# Patient Record
Sex: Female | Born: 1970
Health system: Southern US, Community
[De-identification: ages and names within clinical notes are randomized; demographics above are authoritative.]

## PROBLEM LIST (undated history)

## (undated) DIAGNOSIS — B029 Zoster without complications: Secondary | ICD-10-CM

## (undated) DIAGNOSIS — Z8742 Personal history of other diseases of the female genital tract: Secondary | ICD-10-CM

## (undated) HISTORY — DX: Zoster without complications: B02.9

## (undated) HISTORY — DX: Personal history of other diseases of the female genital tract: Z87.42

## (undated) HISTORY — PX: LASIK: SHX215

---

## 2003-07-16 ENCOUNTER — Encounter: Admission: RE | Admit: 2003-07-16 | Discharge: 2003-07-16 | Payer: Self-pay | Admitting: Family Medicine

## 2003-11-15 ENCOUNTER — Encounter: Admission: RE | Admit: 2003-11-15 | Discharge: 2003-11-15 | Payer: Self-pay | Admitting: Family Medicine

## 2003-11-24 ENCOUNTER — Encounter: Admission: RE | Admit: 2003-11-24 | Discharge: 2003-11-24 | Payer: Self-pay | Admitting: Family Medicine

## 2003-12-01 ENCOUNTER — Encounter: Admission: RE | Admit: 2003-12-01 | Discharge: 2003-12-01 | Payer: Self-pay | Admitting: Family Medicine

## 2004-03-06 ENCOUNTER — Other Ambulatory Visit: Admission: RE | Admit: 2004-03-06 | Discharge: 2004-03-06 | Payer: Self-pay | Admitting: Obstetrics and Gynecology

## 2004-11-19 IMAGING — CR DG LUMBAR SPINE COMPLETE 4+V
5 series · 5 of 5 positions shown · non-contrast
Comparison: none

CLINICAL DATA: Lower back pain extending into both legs for the last two weeks.  
 LUMBOSACRAL SPINE COMPLETE: 
 AP, lateral, and both oblique views of the lumbosacral spine without previous films for comparison show mild thoracolumbar scoliosis.  Intervertebral disc spaces, vertebral body alignment, and posterior elements are well maintained.  There are five typical lumbar segments.

[view not recorded (1 of 5)]
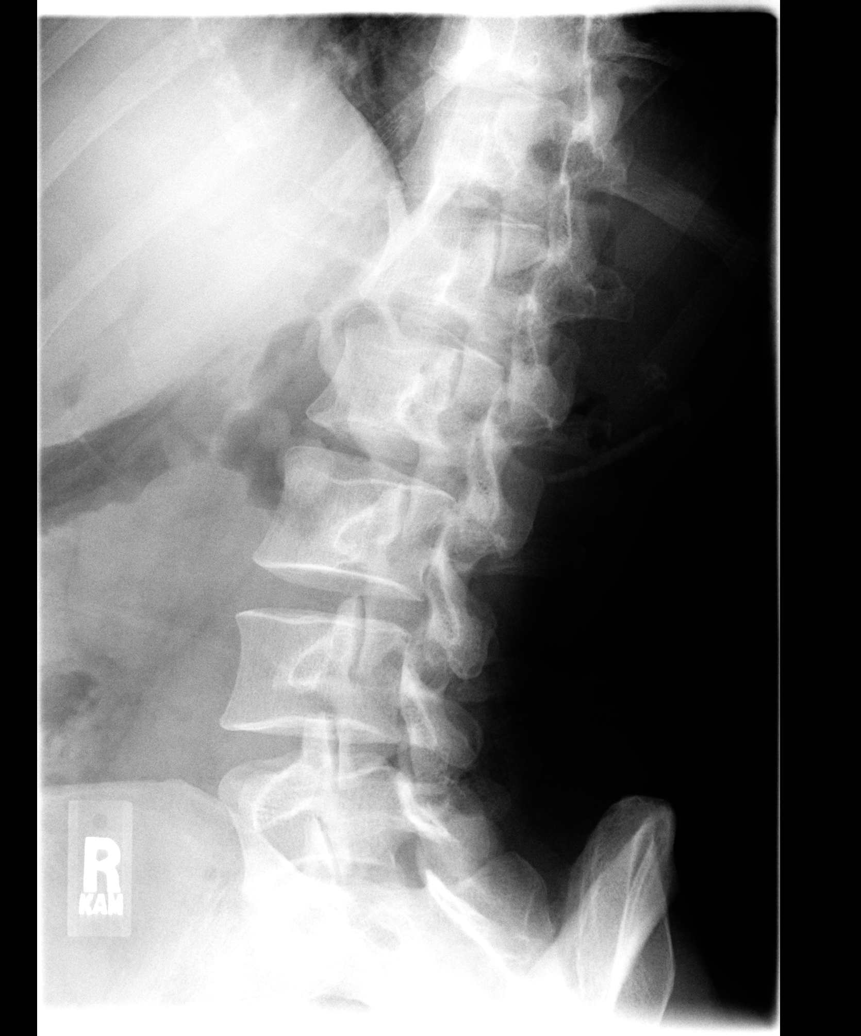

[view not recorded (2 of 5)]
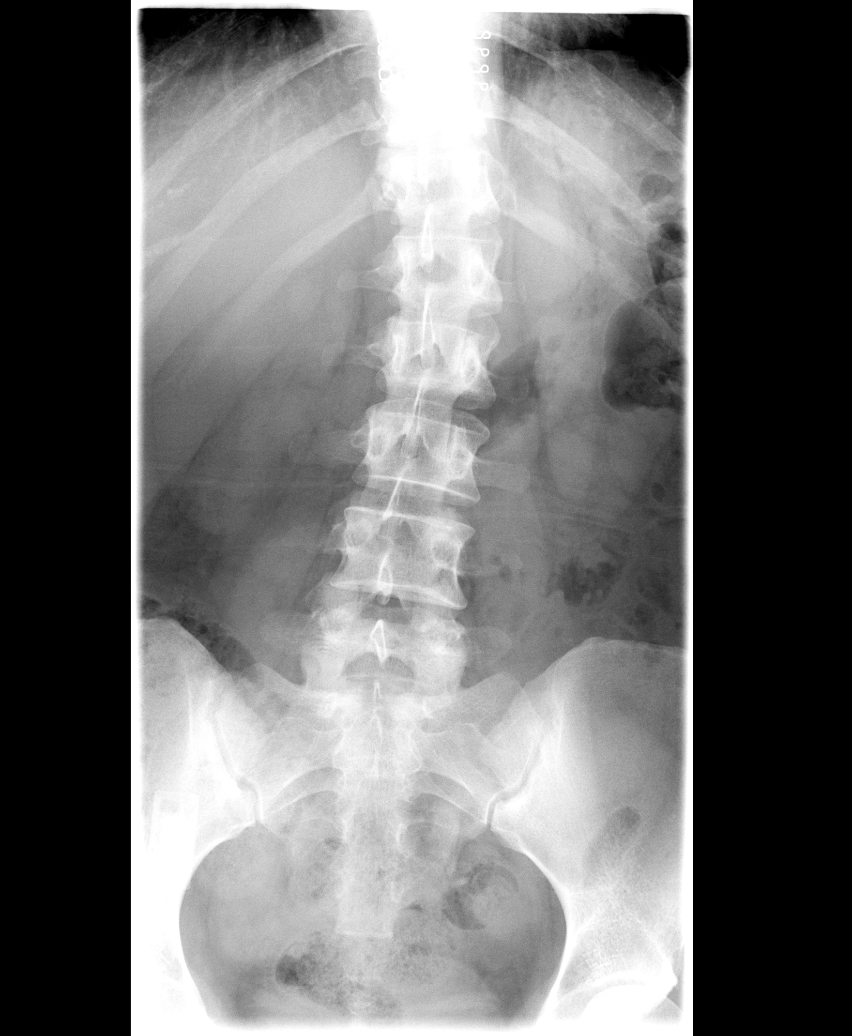

[view not recorded (3 of 5)]
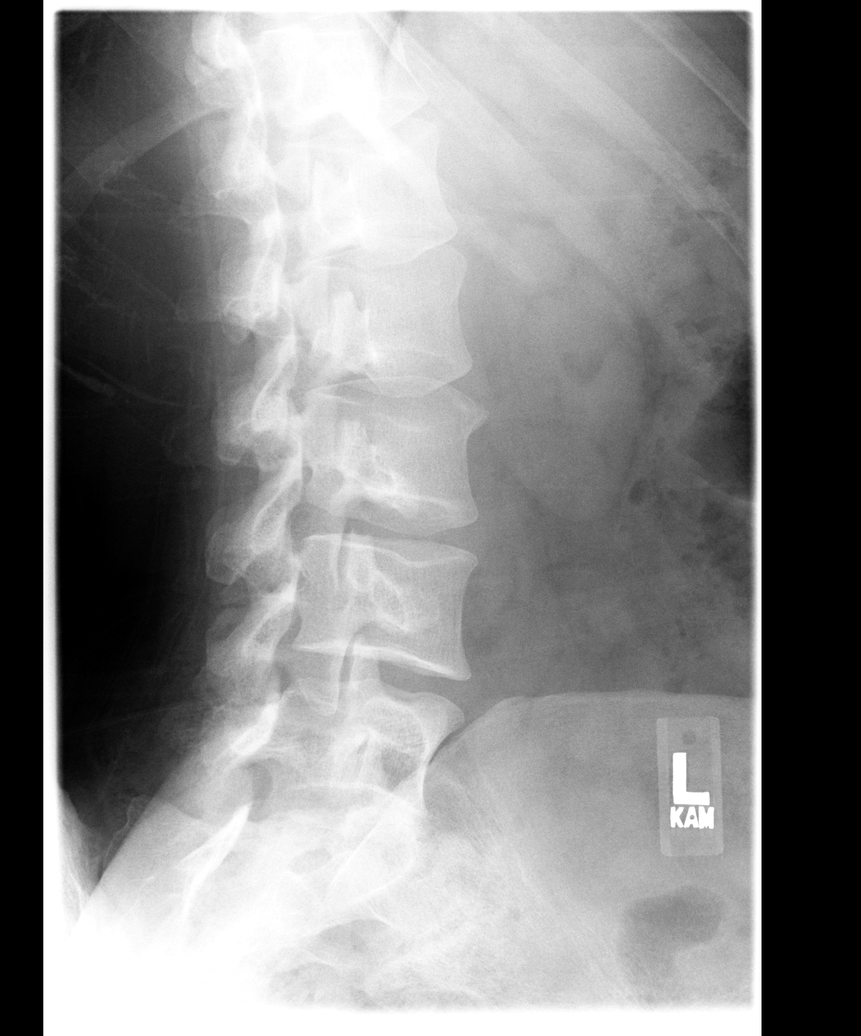

[view not recorded (4 of 5)]
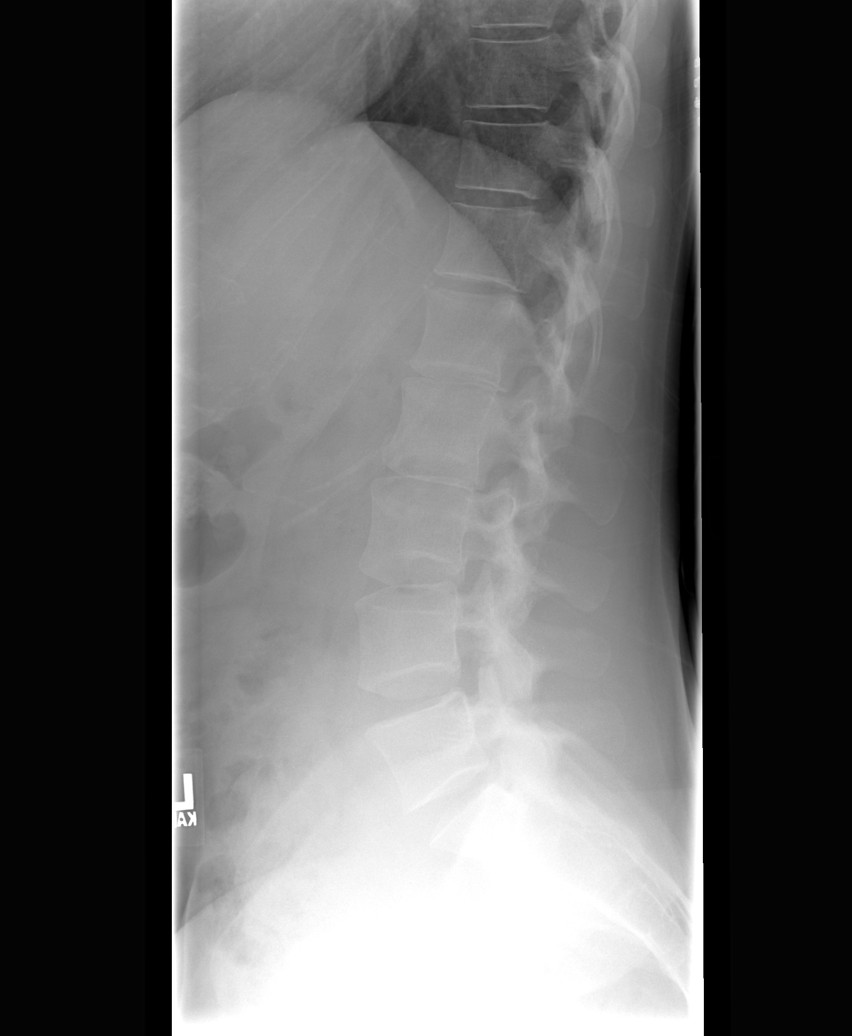

[view not recorded (5 of 5)]
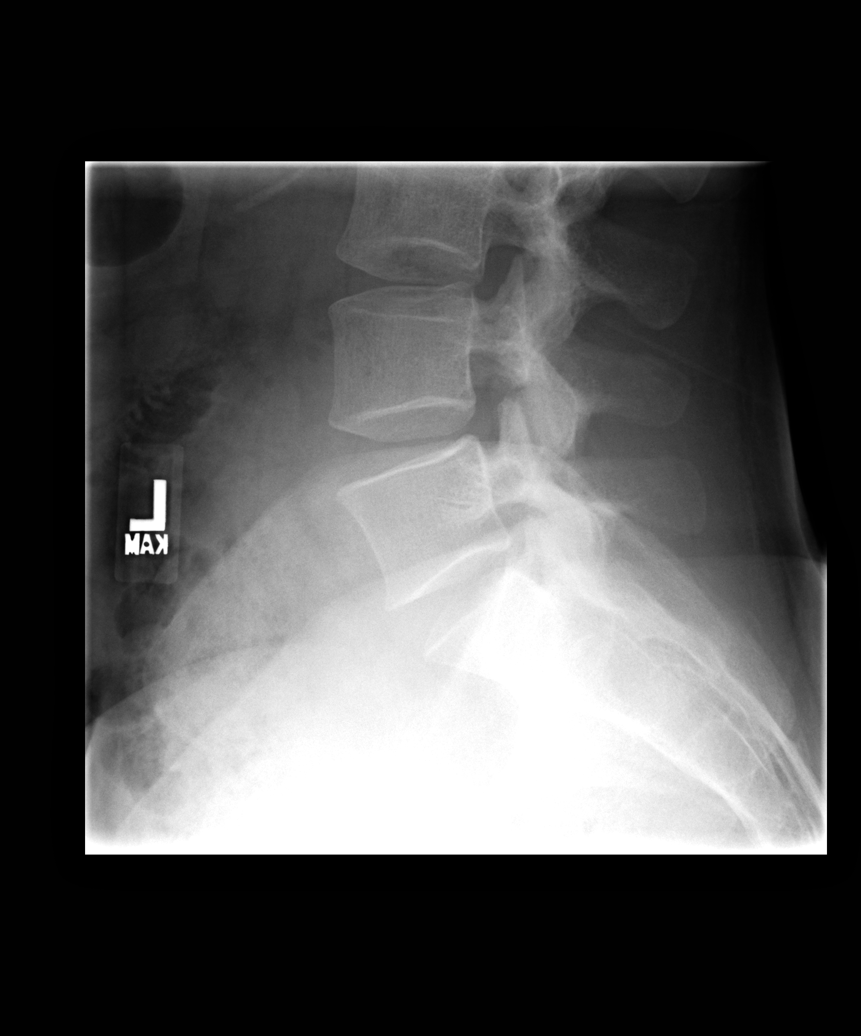

[5 of 5 positions shown; findings below may reference images not displayed]

IMPRESSION: Mild scoliosis.  Otherwise normal lumbosacral spine.

## 2005-04-27 ENCOUNTER — Other Ambulatory Visit: Admission: RE | Admit: 2005-04-27 | Discharge: 2005-04-27 | Payer: Self-pay | Admitting: Obstetrics and Gynecology

## 2006-05-23 ENCOUNTER — Ambulatory Visit: Payer: Self-pay | Admitting: Internal Medicine

## 2006-05-31 ENCOUNTER — Ambulatory Visit: Payer: Self-pay | Admitting: Cardiology

## 2006-06-10 ENCOUNTER — Encounter: Admission: RE | Admit: 2006-06-10 | Discharge: 2006-06-10 | Payer: Self-pay | Admitting: Obstetrics and Gynecology

## 2006-09-19 DIAGNOSIS — Z9889 Other specified postprocedural states: Secondary | ICD-10-CM

## 2007-03-13 ENCOUNTER — Telehealth: Payer: Self-pay | Admitting: Internal Medicine

## 2007-03-17 ENCOUNTER — Ambulatory Visit: Payer: Self-pay | Admitting: Internal Medicine

## 2007-03-17 DIAGNOSIS — R079 Chest pain, unspecified: Secondary | ICD-10-CM

## 2007-03-17 LAB — CONVERTED CEMR LAB
Basophils Absolute: 0 10*3/uL (ref 0.0–0.1)
Creatinine, Ser: 0.8 mg/dL (ref 0.4–1.2)
HCT: 39.1 % (ref 36.0–46.0)
Hemoglobin: 13.5 g/dL (ref 12.0–15.0)
MCHC: 34.4 g/dL (ref 30.0–36.0)
MCV: 90.4 fL (ref 78.0–100.0)
Monocytes Absolute: 0.6 10*3/uL (ref 0.2–0.7)
Neutrophils Relative %: 59.2 % (ref 43.0–77.0)
Potassium: 4.3 meq/L (ref 3.5–5.1)
RDW: 11.5 % (ref 11.5–14.6)
Sodium: 139 meq/L (ref 135–145)
TSH: 1.28 microintl units/mL (ref 0.35–5.50)

## 2007-04-20 ENCOUNTER — Encounter: Payer: Self-pay | Admitting: Internal Medicine

## 2007-09-19 ENCOUNTER — Encounter: Payer: Self-pay | Admitting: Internal Medicine

## 2008-08-08 ENCOUNTER — Encounter: Payer: Self-pay | Admitting: Internal Medicine

## 2009-06-27 ENCOUNTER — Ambulatory Visit: Payer: Self-pay | Admitting: Internal Medicine

## 2009-07-11 ENCOUNTER — Ambulatory Visit: Payer: Self-pay | Admitting: Internal Medicine

## 2009-07-14 LAB — CONVERTED CEMR LAB
Basophils Relative: 0.7 % (ref 0.0–3.0)
CO2: 29 meq/L (ref 19–32)
Calcium: 9.3 mg/dL (ref 8.4–10.5)
Eosinophils Relative: 2.6 % (ref 0.0–5.0)
GFR calc non Af Amer: 84.48 mL/min (ref >60)
HDL: 74.4 mg/dL (ref >39.00)
Hemoglobin: 13 g/dL (ref 12.0–15.0)
Lymphocytes Relative: 28.8 % (ref 12.0–46.0)
Monocytes Relative: 11.7 % (ref 3.0–12.0)
Neutro Abs: 2.9 10*3/uL (ref 1.4–7.7)
RBC: 4.05 M/uL (ref 3.87–5.11)
Sodium: 142 meq/L (ref 135–145)
Total CHOL/HDL Ratio: 2
VLDL: 11.8 mg/dL (ref 0.0–40.0)
WBC: 5.1 10*3/uL (ref 4.5–10.5)

## 2009-12-17 ENCOUNTER — Encounter: Admission: RE | Admit: 2009-12-17 | Discharge: 2009-12-17 | Payer: Self-pay | Admitting: Obstetrics and Gynecology

## 2010-03-30 ENCOUNTER — Encounter: Payer: Self-pay | Admitting: Internal Medicine

## 2010-05-24 ENCOUNTER — Encounter: Payer: Self-pay | Admitting: Family Medicine

## 2010-05-24 ENCOUNTER — Encounter: Payer: Self-pay | Admitting: Obstetrics and Gynecology

## 2010-06-02 NOTE — Letter (Signed)
Summary: Health Exam Form/[facility] [employer]  Health Exam Form/[facility] [employer]   Imported By: Lanelle Bal 07/07/2009 09:03:16  _____________________________________________________________________  External Attachment:    Type:   Image     Comment:   External Document

## 2010-06-02 NOTE — Letter (Signed)
Summary: Cancer Screening/[facility]  Cancer Screening/[facility]   Imported By: Lanelle Bal 07/09/2009 11:00:48  _____________________________________________________________________  External Attachment:    Type:   Image     Comment:   External Document

## 2010-06-02 NOTE — Assessment & Plan Note (Signed)
Summary: CPX/NS/[facility] PAPERWORK   Vital Signs:  Patient profile:   40 year old female Height:      63.50 inches Weight:      132 pounds BMI:     23.10 Pulse rate:   70 / minute BP sitting:   110 / 70  (left arm)  Vitals Entered By: Doristine Devoid (June 27, 2009 2:51 PM) CC: CPX    History of Present Illness: CPX  Allergies: 1)  ! * Ivp Dye  Past History:  Past Medical History: G3 P3 not doing family planning  Past Surgical History: Reviewed history from 03/17/2007 and no changes required. 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 CV:  Denies chest pain or discomfort and swelling of feet. Resp:  Denies cough and shortness of breath. GI:  Denies bloody stools, nausea, and vomiting. Psych:  (+) stress , occasionally depression, not enough to do something.  Physical Exam  General:  alert, well-developed, and well-nourished.   Lungs:  normal respiratory effort, no intercostal retractions, and no accessory muscle use.   Heart:  normal rate, regular rhythm, and no murmur.   Abdomen:  soft, non-tender, no distention, no masses, no guarding, and no rigidity.   Extremities:  no pretibial edema bilaterally  Neurologic:  alert & oriented X3, strength normal in all extremities, and gait normal.   Psych:  Cognition and judgment appear intact. Alert and cooperative with normal attention span and concentration.     Impression & Recommendations:  Problem # 1:  ROUTINE GENERAL MEDICAL EXAM@HEALTH  CARE FACL (ICD-V70.0) Td  2-11 never had a Cscope  sees gyn routinely  continue healthy lifestyle  paper work completed   Complete  Medication List: 1)  Allergy Injections   Other Orders: Tdap => 3yrs IM (11914) Admin 1st Vaccine (78295) TB Skin Test [phone]) Admin of Any Addtl Vaccine (86578)  Patient Instructions: 1)  came back Monday for  a PPD reading  2)  came back fasting for FLP BMP CBC TSH------ DX V70   Preventive Care Screening  Pap Smear:    Date:  10/31/2008    Results:  normal     Immunizations Administered:  Tetanus Vaccine:    Vaccine Type: Tdap    Site: right deltoid    Mfr: GlaxoSmithKline    Dose: 0.5 ml    Route: IM    Given by: Doristine Devoid    Exp. Date: 06/28/2011    Lot #: [id number]  PPD Skin Test:    Vaccine Type: PPD    Site: left forearm    Mfr: Sanofi Pasteur    Dose: 0.1 ml    Route: ID    Given by: Doristine Devoid    Exp. Date: 09/28/2011    Lot #: [id number]   Appended Document: CPX/NS/[facility] PAPERWORK     Clinical Lists Changes  Observations: Added new observation of TB PPDRESULT: negative (06/30/2009 11:45) Added new observation of PPD RESULT: < 5mm (06/30/2009 11:45) Added new observation of TB-PPD RDDTE: 06/30/2009 (06/30/2009 11:45)       PPD Results    Date of reading: 06/30/2009    Results: <  5mm    Interpretation: negative

## 2010-06-04 NOTE — Letter (Signed)
Summary: [facility]  [facility]   Imported By: Lanelle Bal 04/30/2010 11:11:23  _____________________________________________________________________  External Attachment:    Type:   Image     Comment:   External Document

## 2011-01-05 ENCOUNTER — Other Ambulatory Visit: Payer: Self-pay | Admitting: Obstetrics and Gynecology

## 2011-01-05 DIAGNOSIS — Z1231 Encounter for screening mammogram for malignant neoplasm of breast: Secondary | ICD-10-CM

## 2011-01-27 ENCOUNTER — Ambulatory Visit
Admission: RE | Admit: 2011-01-27 | Discharge: 2011-01-27 | Disposition: A | Discharge status: Home / Self Care | Payer: 59 | Source: Ambulatory Visit | Attending: Obstetrics and Gynecology | Admitting: Obstetrics and Gynecology

## 2011-01-27 DIAGNOSIS — Z1231 Encounter for screening mammogram for malignant neoplasm of breast: Secondary | ICD-10-CM

## 2011-12-28 ENCOUNTER — Other Ambulatory Visit: Payer: Self-pay | Admitting: Obstetrics and Gynecology

## 2011-12-28 DIAGNOSIS — Z1231 Encounter for screening mammogram for malignant neoplasm of breast: Secondary | ICD-10-CM

## 2012-02-01 ENCOUNTER — Ambulatory Visit
Admission: RE | Admit: 2012-02-01 | Discharge: 2012-02-01 | Disposition: A | Discharge status: Home / Self Care | Payer: 59 | Source: Ambulatory Visit | Attending: Obstetrics and Gynecology | Admitting: Obstetrics and Gynecology

## 2012-02-01 DIAGNOSIS — Z1231 Encounter for screening mammogram for malignant neoplasm of breast: Secondary | ICD-10-CM

## 2012-06-03 DIAGNOSIS — B029 Zoster without complications: Secondary | ICD-10-CM

## 2012-06-03 HISTORY — DX: Zoster without complications: B02.9

## 2012-08-28 ENCOUNTER — Ambulatory Visit (INDEPENDENT_AMBULATORY_CARE_PROVIDER_SITE_OTHER): Payer: 59 | Admitting: Internal Medicine

## 2012-08-28 ENCOUNTER — Encounter: Payer: Self-pay | Admitting: Internal Medicine

## 2012-08-28 VITALS — BP 108/74 | HR 67 | Wt 134.0 lb

## 2012-08-28 DIAGNOSIS — B070 Plantar wart: Secondary | ICD-10-CM

## 2012-08-28 NOTE — Progress Notes (Signed)
  Subjective:    Patient ID: Deborah Spencer, female    DOB: Mar 27, 1971, 42 y.o.   MRN: [id number]  HPI Acute visit, not seen in >3 years. 3 weeks ago noted hard, sore please at the right food, plantar area. Denies any injury that she can tell.pain worse w/ walking   Past Medical History: G3 P3 not doing family planning  Past Surgical History: lasik surgery Caesarean section x 3  Family History: MI--GM at age 54 breast ca--no colon ca-- GM late in life , GGM?   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    Objective:   Physical Exam BP 108/74  Pulse 67  Wt 134 lb (60.782 kg)  BMI 23.36 kg/m2  SpO2 97%   General -- alert, well-developed, No apparent distress .      Extremities-- no pretibial edema bilaterally. Left foot normal Right foot, plantar area, proximal from the 4 toe has a hard, skin colored structure c/w a wart Psych-- Cognition and judgment appear intact. Alert and cooperative with normal attention span and concentration.  not anxious appearing and not depressed appearing.          Assessment & Plan:

## 2012-08-28 NOTE — Patient Instructions (Addendum)
We are sending you to a podiatrist, please call in few days if you don't hear from [place]

## 2012-08-28 NOTE — Assessment & Plan Note (Signed)
Lesion consistent with a plantar wart. Plan: Refer to podiatry.

## 2013-01-08 ENCOUNTER — Other Ambulatory Visit: Payer: Self-pay

## 2013-01-08 DIAGNOSIS — Z1231 Encounter for screening mammogram for malignant neoplasm of breast: Secondary | ICD-10-CM

## 2013-02-06 ENCOUNTER — Ambulatory Visit
Admission: RE | Admit: 2013-02-06 | Discharge: 2013-02-06 | Disposition: A | Discharge status: Home / Self Care | Payer: 59 | Source: Ambulatory Visit

## 2013-02-06 DIAGNOSIS — Z1231 Encounter for screening mammogram for malignant neoplasm of breast: Secondary | ICD-10-CM

## 2013-06-06 ENCOUNTER — Encounter: Payer: Self-pay | Admitting: Nurse Practitioner

## 2013-06-06 ENCOUNTER — Ambulatory Visit (INDEPENDENT_AMBULATORY_CARE_PROVIDER_SITE_OTHER): Payer: BC Managed Care – PPO | Admitting: Nurse Practitioner

## 2013-06-06 VITALS — BP 110/70 | HR 66 | Temp 98.5°F | Ht 63.16 in | Wt 138.6 lb

## 2013-06-06 DIAGNOSIS — B029 Zoster without complications: Secondary | ICD-10-CM

## 2013-06-06 MED ORDER — LIDOCAINE 5 % EX PTCH
1.0000 | MEDICATED_PATCH | CUTANEOUS | Status: DC
Start: 2013-06-06 — End: 2013-07-17

## 2013-06-06 MED ORDER — VALACYCLOVIR HCL 1 G PO TABS: 1000.0000 mg | ORAL_TABLET | Freq: Three times a day (TID) | ORAL | Status: DC

## 2013-06-06 NOTE — Progress Notes (Signed)
   Subjective:    Patient ID: Deborah Spencer, female    DOB: 12-06-1970, 43 y.o.   MRN: [id number]  Rash This is a new problem. The current episode started in the past 7 days (3da). The problem has been gradually worsening since onset. The affected locations include the torso. The rash is characterized by blistering. She was exposed to nothing. Associated symptoms include fatigue. Pertinent negatives include no congestion, cough, diarrhea, fever, joint pain, shortness of breath or sore throat. Past treatments include nothing. Her past medical history is significant for varicella.      Review of Systems  Constitutional: Positive for fatigue. Negative for fever.  HENT: Negative for congestion and sore throat.   Respiratory: Negative for cough and shortness of breath.   Gastrointestinal: Negative for abdominal pain and diarrhea.  Musculoskeletal: Negative for arthralgias, joint pain and myalgias.  Skin: Positive for rash (burns, feels like sunburn).  Neurological: Negative for headaches.       Objective:   Physical Exam  Vitals reviewed. Constitutional: She is oriented to person, place, and time. She appears well-developed and well-nourished. No distress.  HENT:  Head: Normocephalic and atraumatic.  Eyes: Conjunctivae are normal. Right eye exhibits no discharge. Left eye exhibits no discharge.  Cardiovascular: Normal rate.   Pulmonary/Chest: Effort normal.  Neurological: She is alert and oriented to person, place, and time.  Skin: Skin is warm and dry. Rash noted. Rash is vesicular.     Psychiatric: She has a normal mood and affect. Her behavior is normal. Thought content normal.          Assessment & Plan:  1. Shingles Vesicular rash, dermatomal pattern L torso & upper thigh - valACYclovir (VALTREX) 1000 MG tablet; Take 1 tablet (1,000 mg total) by mouth 3 (three) times daily.  Dispense: 20 tablet; Refill: 0 - lidocaine (LIDODERM) 5 %; Place 1 patch onto the skin daily.  Remove & Discard patch within 12 hours or as directed by MD  Dispense: 30 patch; Refill: 0 F/u 6 wks.

## 2013-06-06 NOTE — Progress Notes (Signed)
Pre-visit discussion using our clinic review tool. No additional management support is needed unless otherwise documented below in the visit note.  

## 2013-06-06 NOTE — Patient Instructions (Signed)
Please follow up on 6 weeks or sooner if feeling worse. Mild soap daily. Feel better!  Shingles Shingles (herpes zoster) is an infection that is caused by the same virus that causes chickenpox (varicella). The infection causes a painful skin rash and fluid-filled blisters, which eventually break open, crust over, and heal. It may occur in any area of the body, but it usually affects only one side of the body or face. The pain of shingles usually lasts about 1 month. However, some people with shingles may develop long-term (chronic) pain in the affected area of the body. Shingles often occurs many years after the person had chickenpox. It is more common:  In people older than 50 years.  In people with weakened immune systems, such as those with HIV, AIDS, or cancer.  In people taking medicines that weaken the immune system, such as transplant medicines.  In people under great stress. CAUSES  Shingles is caused by the varicella zoster virus (VZV), which also causes chickenpox. After a person is infected with the virus, it can remain in the person's body for years in an inactive state (dormant). To cause shingles, the virus reactivates and breaks out as an infection in a nerve root. The virus can be spread from person to person (contagious) through contact with open blisters of the shingles rash. It will only spread to people who have not had chickenpox. When these people are exposed to the virus, they may develop chickenpox. They will not develop shingles. Once the blisters scab over, the person is no longer contagious and cannot spread the virus to others. SYMPTOMS  Shingles shows up in stages. The initial symptoms may be pain, itching, and tingling in an area of the skin. This pain is usually described as burning, stabbing, or throbbing.In a few days or weeks, a painful red rash will appear in the area where the pain, itching, and tingling were felt. The rash is usually on one side of the body in a  band or belt-like pattern. Then, the rash usually turns into fluid-filled blisters. They will scab over and dry up in approximately 2 3 weeks. Flu-like symptoms may also occur with the initial symptoms, the rash, or the blisters. These may include:  Fever.  Chills.  Headache.  Upset stomach. DIAGNOSIS  Your caregiver will perform a skin exam to diagnose shingles. Skin scrapings or fluid samples may also be taken from the blisters. This sample will be examined under a microscope or sent to a lab for further testing. TREATMENT  There is no specific cure for shingles. Your caregiver will likely prescribe medicines to help you manage the pain, recover faster, and avoid long-term problems. This may include antiviral drugs, anti-inflammatory drugs, and pain medicines. HOME CARE INSTRUCTIONS   Take a cool bath or apply cool compresses to the area of the rash or blisters as directed. This may help with the pain and itching.   Only take over-the-counter or prescription medicines as directed by your caregiver.   Rest as directed by your caregiver.  Keep your rash and blisters clean with mild soap and cool water or as directed by your caregiver.  Do not pick your blisters or scratch your rash. Apply an anti-itch cream or numbing creams to the affected area as directed by your caregiver.  Keep your shingles rash covered with a loose bandage (dressing).  Avoid skin contact with:  Babies.   Pregnant women.   Children with eczema.   Elderly people with transplants.  People with chronic illnesses, such as leukemia or AIDS.   Wear loose-fitting clothing to help ease the pain of material rubbing against the rash.  Keep all follow-up appointments with your caregiver.If the area involved is on your face, you may receive a referral for follow-up to a specialist, such as an eye doctor (ophthalmologist) or an ear, nose, and throat (ENT) doctor. Keeping all follow-up appointments will  help you avoid eye complications, chronic pain, or disability.  SEEK IMMEDIATE MEDICAL CARE IF:   You have facial pain, pain around the eye area, or loss of feeling on one side of your face.  You have ear pain or ringing in your ear.  You have loss of taste.  Your pain is not relieved with prescribed medicines.   Your redness or swelling spreads.   You have more pain and swelling.  Your condition is worsening or has changed.   You have a feveror persistent symptoms for more than 2 3 days.  You have a fever and your symptoms suddenly get worse. MAKE SURE YOU:  Understand these instructions.  Will watch your condition.  Will get help right away if you are not doing well or get worse. Document Released: 04/19/2005 Document Revised: 01/12/2012 Document Reviewed: 12/02/2011 [facility] Patient Information 2014 [place], [place].

## 2013-07-17 ENCOUNTER — Ambulatory Visit (INDEPENDENT_AMBULATORY_CARE_PROVIDER_SITE_OTHER): Payer: BC Managed Care – PPO | Admitting: Internal Medicine

## 2013-07-17 ENCOUNTER — Encounter: Payer: Self-pay | Admitting: Internal Medicine

## 2013-07-17 VITALS — BP 110/68 | HR 68 | Temp 97.7°F | Wt 137.0 lb

## 2013-07-17 DIAGNOSIS — B029 Zoster without complications: Secondary | ICD-10-CM

## 2013-07-17 NOTE — Progress Notes (Signed)
Pre visit review using our clinic review tool, if applicable. No additional management support is needed unless otherwise documented below in the visit note. 

## 2013-07-17 NOTE — Progress Notes (Signed)
   Subjective:    Patient ID: Deborah Spencer, female    DOB: [birth year], 43 y.o.   MRN: [id number]  DOS:  07/17/2013 Type of  visit:  Followup from previous visit    Was seen last month with shingles, status post Valtrex, used a  Lidoderm patch. Pain was intense for 2 weeks but now is essentially resolved.  ROS On looking back, the week before the shingles, she was under a lot of stress, didn't have  much asleep, had URI/viral syndrome as well.   Past Medical History  Diagnosis Date  . Shingles 06-2012    Past Surgical History  Procedure Laterality Date  . Lasik    . Cesarean section      x 3     History   Social History  . Marital Status: Married    Spouse Name: N/A    Number of Children: 3  . Years of Education: N/A   Occupational History  . subs- teacher     Social History Main Topics  . Smoking status: Never Smoker   . Smokeless tobacco: Never Used  . Alcohol Use: Yes     Comment: socially   . Drug Use: No  . Sexual Activity: Not on file   Other Topics Concern  . Not on file   Social History Narrative  . No narrative on file        Medication List       This list is accurate as of: 07/17/13 11:59 PM.  Always use your most recent med list.               CALCIUM PO  Take by mouth daily.     NON FORMULARY  every 14 (fourteen) days. Allergy Injections           Objective:   Physical Exam BP 110/68  Pulse 68  Temp(Src) 97.7 F (36.5 C)  Wt 137 lb (62.143 kg)  SpO2 98% General -- alert, well-developed, NAD.  Skin-- See previous note, she has a few hyperpigmented patches and areas of a rash  Neurologic--  alert & oriented X3. Speech normal, gait normal, strength normal in all extremities.  Psych-- Cognition and judgment appear intact. Cooperative with normal attention span and concentration. No anxious or depressed appearing.      Assessment & Plan:    Shingles-- Resolved. Patient has several questions about this , I answered  them to the best of my ability. For her general care she sees gynecology, followup here when necessary.

## 2013-07-17 NOTE — Patient Instructions (Signed)
Please call if the rash comes back

## 2014-01-01 ENCOUNTER — Other Ambulatory Visit: Payer: Self-pay

## 2014-01-01 DIAGNOSIS — Z1231 Encounter for screening mammogram for malignant neoplasm of breast: Secondary | ICD-10-CM

## 2014-02-07 ENCOUNTER — Ambulatory Visit: Payer: BC Managed Care – PPO

## 2014-05-06 LAB — HM MAMMOGRAPHY: HM MAMMO: NORMAL

## 2014-05-14 LAB — HM PAP SMEAR

## 2014-07-15 ENCOUNTER — Encounter: Payer: Self-pay | Admitting: Internal Medicine

## 2014-07-15 ENCOUNTER — Ambulatory Visit (INDEPENDENT_AMBULATORY_CARE_PROVIDER_SITE_OTHER): Payer: BLUE CROSS/BLUE SHIELD | Admitting: Internal Medicine

## 2014-07-15 VITALS — BP 118/64 | HR 68 | Temp 97.6°F | Wt 138.5 lb

## 2014-07-15 DIAGNOSIS — R319 Hematuria, unspecified: Secondary | ICD-10-CM

## 2014-07-15 DIAGNOSIS — R350 Frequency of micturition: Secondary | ICD-10-CM

## 2014-07-15 DIAGNOSIS — N390 Urinary tract infection, site not specified: Secondary | ICD-10-CM

## 2014-07-15 LAB — POCT URINALYSIS DIPSTICK
Bilirubin, UA: NEGATIVE
GLUCOSE UA: NEGATIVE
KETONES UA: NEGATIVE
Nitrite, UA: NEGATIVE
PROTEIN UA: NEGATIVE
Spec Grav, UA: 1.025
UROBILINOGEN UA: 0.2
pH, UA: 6

## 2014-07-15 MED ORDER — NITROFURANTOIN MONOHYD MACRO 100 MG PO CAPS: 100.0000 mg | ORAL_CAPSULE | Freq: Two times a day (BID) | ORAL | Status: DC

## 2014-07-15 NOTE — Progress Notes (Signed)
Pre visit review using our clinic review tool, if applicable. No additional management support is needed unless otherwise documented below in the visit note. 

## 2014-07-15 NOTE — Progress Notes (Signed)
   Subjective:    Patient ID: Deborah Spencer, female    DOB: May 13, 1970, 44 y.o.   MRN: [id number]  DOS:  07/15/2014 Type of visit - description : acute Interval history: Symptoms started 2 days ago with urinary pressure, urgency and a feeling that she didn't void completely. Suspects she  has a UTI, no history of frequent or recurrent UTIs   Review of Systems Denies fever chills No nausea, vomiting, diarrhea No vaginal bleeding or discharge  Past Medical History  Diagnosis Date  . Shingles 06-2012    Past Surgical History  Procedure Laterality Date  . Lasik    . Cesarean section      x 3     History   Social History  . Marital Status: Married    Spouse Name: N/A  . Number of Children: 3  . Years of Education: N/A   Occupational History  . subs- teacher     Social History Main Topics  . Smoking status: Never Smoker   . Smokeless tobacco: Never Used  . Alcohol Use: Yes     Comment: socially   . Drug Use: No  . Sexual Activity: Not on file   Other Topics Concern  . Not on file   Social History Narrative        Medication List       This list is accurate as of: 07/15/14  6:12 PM.  Always use your most recent med list.               CALCIUM PO  Take by mouth daily.     nitrofurantoin (macrocrystal-monohydrate) 100 MG capsule  Commonly known as:  MACROBID  Take 1 capsule (100 mg total) by mouth 2 (two) times daily.     NON FORMULARY  every 14 (fourteen) days. Allergy Injections           Objective:   Physical Exam BP 118/64 mmHg  Pulse 68  Temp(Src) 97.6 F (36.4 C) (Oral)  Wt 138 lb 8 oz (62.823 kg)  SpO2 97%  LMP 06/30/2014  General:   Well developed, well nourished . NAD.  HEENT:  Normocephalic . Face symmetric, atraumatic abd-- Not distended, nontender, no mass or rebound.   no CVA tenderness skin: Not pale. Not jaundice Neurologic:  alert & oriented X3.  Speech normal, gait appropriate for age and unassisted Psych--    Cognition and judgment appear intact.  Cooperative with normal attention span and concentration.  Behavior appropriate. No anxious or depressed appearing.        Assessment & Plan:      UTI udip  and symptoms consistent with a UTI. Will treat with macrobid, OTC Azo-Standard. (Birth control is by using condoms, last menstrual period 06/30/2014) Interestingly, the patient was sexually active few hours before the onset of symptoms, if this becomes a pattern she will let me know or talk with her gynecologist.

## 2014-07-15 NOTE — Patient Instructions (Signed)
Take antibiotics as prescribed Okay to use Azo-Standard OTC for pain Drink plenty of fluids Call if not improving in the next few days

## 2014-07-16 LAB — URINALYSIS, ROUTINE W REFLEX MICROSCOPIC
BILIRUBIN URINE: NEGATIVE
KETONES UR: NEGATIVE
Nitrite: NEGATIVE
Specific Gravity, Urine: 1.015 (ref 1.000–1.030)
Total Protein, Urine: NEGATIVE
URINE GLUCOSE: NEGATIVE
UROBILINOGEN UA: 0.2 (ref 0.0–1.0)
pH: 6 (ref 5.0–8.0)

## 2014-07-18 LAB — URINE CULTURE: Colony Count: >=100000

## 2014-09-25 LAB — HM PAP SMEAR: HM Pap smear: NEGATIVE

## 2015-10-20 LAB — HM MAMMOGRAPHY

## 2015-10-20 LAB — HM PAP SMEAR

## 2015-10-23 ENCOUNTER — Encounter: Payer: Self-pay | Admitting: Internal Medicine

## 2016-02-20 HISTORY — PX: ENDOMETRIAL BIOPSY: SHX622

## 2016-12-07 LAB — HM MAMMOGRAPHY

## 2016-12-15 ENCOUNTER — Encounter: Payer: Self-pay | Admitting: Internal Medicine

## 2017-12-13 LAB — HM PAP SMEAR

## 2017-12-13 LAB — HM MAMMOGRAPHY

## 2017-12-14 ENCOUNTER — Encounter: Payer: Self-pay | Admitting: Internal Medicine

## 2017-12-20 ENCOUNTER — Encounter: Payer: Self-pay | Admitting: Internal Medicine

## 2019-02-16 DIAGNOSIS — Z1231 Encounter for screening mammogram for malignant neoplasm of breast: Secondary | ICD-10-CM | POA: Diagnosis not present

## 2019-02-16 DIAGNOSIS — Z130 Encounter for screening for diseases of the blood and blood-forming organs and certain disorders involving the immune mechanism: Secondary | ICD-10-CM | POA: Diagnosis not present

## 2019-02-16 DIAGNOSIS — Z01419 Encounter for gynecological examination (general) (routine) without abnormal findings: Secondary | ICD-10-CM | POA: Diagnosis not present

## 2019-02-16 DIAGNOSIS — Z6825 Body mass index (BMI) 25.0-25.9, adult: Secondary | ICD-10-CM | POA: Diagnosis not present

## 2019-02-16 DIAGNOSIS — Z1389 Encounter for screening for other disorder: Secondary | ICD-10-CM | POA: Diagnosis not present

## 2019-12-24 DIAGNOSIS — D225 Melanocytic nevi of trunk: Secondary | ICD-10-CM | POA: Diagnosis not present

## 2019-12-24 DIAGNOSIS — Z1283 Encounter for screening for malignant neoplasm of skin: Secondary | ICD-10-CM | POA: Diagnosis not present

## 2020-03-04 DIAGNOSIS — R319 Hematuria, unspecified: Secondary | ICD-10-CM | POA: Diagnosis not present

## 2020-03-04 DIAGNOSIS — Z1231 Encounter for screening mammogram for malignant neoplasm of breast: Secondary | ICD-10-CM | POA: Diagnosis not present

## 2020-03-04 DIAGNOSIS — Z1389 Encounter for screening for other disorder: Secondary | ICD-10-CM | POA: Diagnosis not present

## 2020-03-04 DIAGNOSIS — Z6825 Body mass index (BMI) 25.0-25.9, adult: Secondary | ICD-10-CM | POA: Diagnosis not present

## 2020-03-04 DIAGNOSIS — Z01419 Encounter for gynecological examination (general) (routine) without abnormal findings: Secondary | ICD-10-CM | POA: Diagnosis not present

## 2020-03-04 DIAGNOSIS — Z130 Encounter for screening for diseases of the blood and blood-forming organs and certain disorders involving the immune mechanism: Secondary | ICD-10-CM | POA: Diagnosis not present

## 2020-07-29 DIAGNOSIS — Z1211 Encounter for screening for malignant neoplasm of colon: Secondary | ICD-10-CM | POA: Diagnosis not present

## 2020-07-29 DIAGNOSIS — K582 Mixed irritable bowel syndrome: Secondary | ICD-10-CM | POA: Diagnosis not present

## 2020-07-29 DIAGNOSIS — K219 Gastro-esophageal reflux disease without esophagitis: Secondary | ICD-10-CM | POA: Diagnosis not present

## 2020-08-27 DIAGNOSIS — Z800 Family history of malignant neoplasm of digestive organs: Secondary | ICD-10-CM | POA: Diagnosis not present

## 2020-08-27 DIAGNOSIS — D128 Benign neoplasm of rectum: Secondary | ICD-10-CM | POA: Diagnosis not present

## 2020-08-27 DIAGNOSIS — Z1211 Encounter for screening for malignant neoplasm of colon: Secondary | ICD-10-CM | POA: Diagnosis not present

## 2020-08-27 DIAGNOSIS — K621 Rectal polyp: Secondary | ICD-10-CM | POA: Diagnosis not present

## 2020-08-27 DIAGNOSIS — K635 Polyp of colon: Secondary | ICD-10-CM | POA: Diagnosis not present

## 2020-08-27 DIAGNOSIS — D123 Benign neoplasm of transverse colon: Secondary | ICD-10-CM | POA: Diagnosis not present

## 2020-08-27 LAB — HM COLONOSCOPY

## 2021-01-13 ENCOUNTER — Encounter: Payer: Self-pay | Admitting: Nurse Practitioner

## 2021-01-13 ENCOUNTER — Ambulatory Visit (INDEPENDENT_AMBULATORY_CARE_PROVIDER_SITE_OTHER): Payer: BC Managed Care – PPO | Admitting: Nurse Practitioner

## 2021-01-13 ENCOUNTER — Other Ambulatory Visit: Payer: Self-pay

## 2021-01-13 VITALS — BP 110/80 | HR 76 | Temp 97.5°F | Ht 63.5 in | Wt 148.6 lb

## 2021-01-13 DIAGNOSIS — M7061 Trochanteric bursitis, right hip: Secondary | ICD-10-CM

## 2021-01-13 DIAGNOSIS — N951 Menopausal and female climacteric states: Secondary | ICD-10-CM | POA: Insufficient documentation

## 2021-01-13 DIAGNOSIS — R7989 Other specified abnormal findings of blood chemistry: Secondary | ICD-10-CM

## 2021-01-13 DIAGNOSIS — R945 Abnormal results of liver function studies: Secondary | ICD-10-CM | POA: Insufficient documentation

## 2021-01-13 HISTORY — DX: Other specified abnormal findings of blood chemistry: R79.89

## 2021-01-13 MED ORDER — NAPROXEN SODIUM 550 MG PO TABS
550.0000 mg | ORAL_TABLET | Freq: Two times a day (BID) | ORAL | 0 refills | Status: DC
Start: 2021-01-13 — End: 2022-04-08

## 2021-01-13 MED ORDER — METHYLPREDNISOLONE ACETATE 80 MG/ML IJ SUSP
40.0000 mg | Freq: Once | INTRAMUSCULAR | Status: AC
  Administered 2021-01-13: 40 mg via INTRA_ARTICULAR

## 2021-01-13 NOTE — Patient Instructions (Addendum)
Start hip exercise on 01/15/2021 Apply cold compression after exercise. Start naproxen tabs on 01/15/21. You can use nonsteroidal anti-inflammatories like naproxen and cold compresses. Rest is recommended in the next 24 hours. You need to report immediately  if fever, chills or any signs of infection develop.   Hip Bursitis Hip bursitis is swelling of one or more fluid-filled sacs (bursae) in your hip joint. This condition can cause pain, and your symptoms may come and go over time. What are the causes? Repeated use of your hip muscles. Injury to the hip. Weak butt muscles. Bone spurs. Infection. In some cases, the cause may not be known. What increases the risk? You are more likely to develop this condition if: You had a past hip injury or hip surgery. You have a condition, such as arthritis, gout, diabetes, or thyroid disease. You have spine problems. You have one leg that is shorter than the other. You run a lot or do long-distance running. You play sports where there is a risk of injury or falling, such as football, martial arts, or skiing. What are the signs or symptoms? Symptoms may come and go, and they often include: Pain in the hip or groin area. Pain may get worse when you move your hip. Tenderness and swelling of the hip. In rare cases, the bursa may become infected. If this happens, you may get a fever, as well as have warmth and redness in the hip area. How is this treated? This condition is treated by: Resting your hip. Icing your hip. Wrapping the hip area with an elastic bandage (compression wrap). Keeping the hip raised. Other treatments may include medicine, draining fluid out of the bursa, or using crutches, a cane, or a walker. Surgery may be needed, but this is rare. Long-term treatment may include doing exercises to help your strength and flexibility. It may also include lifestyle changes like losing weight to lessen the strain on your hip. Follow these  instructions at home: Managing pain, stiffness, and swelling   If told, put ice on the painful area. Put ice in a plastic bag. Place a towel between your skin and the bag. Leave the ice on for 20 minutes, 2-3 times a day. Raise your hip by putting a pillow under your hips while you lie down. Stop if you feel pain. If told, put heat on the affected area. Do this as often as told by your doctor. Use a moist heat pack or a heating pad as told by your doctor. Place a towel between your skin and the heat source. Leave the heat on for 20-30 minutes. Take off the heat if your skin turns bright red. This is very important if you are unable to feel pain, heat, or cold. You may have a greater risk of getting burned. Activity Do not use your hip to support your body weight until your doctor says that you can. Use crutches, a cane, or a walker as told by your doctor. If the affected leg is one that you use to drive, ask your doctor if it is safe to drive. Rest and protect your hip as much as you can until you feel better. Return to your normal activities as told by your doctor. Ask your doctor what activities are safe for you. Do exercises as told by your doctor. General instructions Take over-the-counter and prescription medicines only as told by your doctor. Gently rub and stretch your injured area as often as is comfortable. Wear elastic bandages only as  told by your doctor. If one of your legs is shorter than the other, get fitted for a shoe insert or orthotic. Keep a healthy weight. Follow instructions from your doctor. Keep all follow-up visits as told by your doctor. This is important. How is this prevented? Exercise regularly, as told by your doctor. Wear the right shoes for the sport you play. Warm up and stretch before being active. Cool down and stretch after being active. Take breaks often from repeated activity. Avoid activities that bother your hip or cause pain. Avoid sitting down  for a long time. Where to find more information American [employer]: orthoinfo.aaos.org Contact a doctor if: You have a fever. You have new symptoms. You have trouble walking or doing everyday activities. You have pain that gets worse or does not get better with medicine. Your skin around your hip is red. You get a feeling of warmth in your hip area. Get help right away if: You cannot move your hip. You have very bad pain. You cannot control the muscles in your feet. Summary Hip bursitis is swelling of one or more fluid-filled sacs (bursae) in your hip joint. Symptoms often come and go over time. This condition is often treated by resting and icing the hip. It also may help to keep the area raised and wrapped in an elastic bandage. Other treatments may be needed. This information is not intended to replace advice given to you by your health care provider. Make sure you discuss any questions you have with your health care provider. Document Revised: 02/19/2019 Document Reviewed: 12/26/2017 Elsevier Patient Education  2022 [employer]  Hip Bursitis Rehab Ask your health care provider which exercises are safe for you. Do exercises exactly as told by your health care provider and adjust them as directed. It is normal to feel mild stretching, pulling, tightness, or discomfort as you do these exercises. Stop right away if you feel sudden pain or your pain gets worse. Do not begin these exercises until told by your health care provider. Stretching exercise This exercise warms up your muscles and joints and improves the movement and flexibility of your hip. This exercise also helps to relieve pain and stiffness. Iliotibial band stretch An iliotibial band is a strong band of muscle tissue that runs from the outer side of your hip to the outer side of your thigh and knee. Lie on your side with your left / right leg in the top position. Bend your left / right knee and grab  your ankle. Stretch out your bottom arm to help you balance. Slowly bring your knee back so your thigh is behind your body. Slowly lower your knee toward the floor until you feel a gentle stretch on the outside of your left / right thigh. If you do not feel a stretch and your knee will not fall farther, place the heel of your other foot on top of your knee and pull your knee down toward the floor with your foot. Hold this position for _____10_____ seconds. Slowly return to the starting position. Repeat ____10______ times. Complete this exercise _____1_____ times a day. Strengthening exercises These exercises build strength and endurance in your hip and pelvis. Endurance is the ability to use your muscles for a long time, even after they get tired. Bridge This exercise strengthens the muscles that move your thigh backward (hip extensors). Lie on your back on a firm surface with your knees bent and your feet flat on the floor. Tighten  your buttocks muscles and lift your buttocks off the floor until your trunk is level with your thighs. Do not arch your back. You should feel the muscles working in your buttocks and the back of your thighs. If you do not feel these muscles, slide your feet 1-2 inches (2.5-5 cm) farther away from your buttocks. If this exercise is too easy, try doing it with your arms crossed over your chest. Hold this position for __________ seconds. Slowly lower your hips to the starting position. Let your muscles relax completely after each repetition. Repeat __________ times. Complete this exercise __________ times a day. Squats This exercise strengthens the muscles in front of your thigh and knee (quadriceps). Stand in front of a table, with your feet and knees pointing straight ahead. You may rest your hands on the table for balance but not for support. Slowly bend your knees and lower your hips like you are going to sit in a chair. Keep your weight over your heels, not over  your toes. Keep your lower legs upright so they are parallel with the table legs. Do not let your hips go lower than your knees. Do not bend lower than told by your health care provider. If your hip pain increases, do not bend as low. Hold the squat position for __________ seconds. Slowly push with your legs to return to standing. Do not use your hands to pull yourself to standing. Repeat __________ times. Complete this exercise __________ times a day. Hip hike Stand sideways on a bottom step. Stand on your left / right leg with your other foot unsupported next to the step. You can hold on to the railing or wall for balance if needed. Keep your knees straight and your torso square. Then lift your left / right hip up toward the ceiling. Hold this position for __________ seconds. Slowly let your left / right hip lower toward the floor, past the starting position. Your foot should get closer to the floor. Do not lean or bend your knees. Repeat __________ times. Complete this exercise __________ times a day. Single leg stand Without shoes, stand near a railing or in a doorway. You may hold on to the railing or door frame as needed for balance. Squeeze your left / right buttock muscles, then lift up your other foot. Do not let your left / right hip push out to the side. It is helpful to stand in front of a mirror for this exercise so you can watch your hip. Hold this position for __________ seconds. Repeat __________ times. Complete this exercise __________ times a day. This information is not intended to replace advice given to you by your health care provider. Make sure you discuss any questions you have with your health care provider. Document Revised: 08/14/2018 Document Reviewed: 08/14/2018 Elsevier Patient Education  2022 [employer].

## 2021-01-13 NOTE — Progress Notes (Signed)
Subjective:  Patient ID: Deborah Spencer, female    DOB: 16-May-1970  Age: 50 y.o. MRN: [id number]  CC: Establish Care (Pt would like to discuss pain and burning sensation that comes and goes in her right hip. /Symptoms have been going on x 2-3 months. )  Hip Pain  The incident occurred more than 1 week ago (24months). There was no injury mechanism. The pain is present in the right hip. The quality of the pain is described as burning and aching. The pain is moderate. The pain has been Constant since onset. Pertinent negatives include no inability to bear weight, loss of motion, loss of sensation, muscle weakness, numbness or tingling. She reports no foreign bodies present. The symptoms are aggravated by movement, weight bearing and palpation. She has tried rest for the symptoms. The treatment provided mild relief.  Right lateral hip pain, describes as burning, Worse at night and unable to lay on right side. Denies any GI/GU symptoms, no pelvic with intercourse. Had similar symptoms several years ago, improved with injection and PT. Her normal Exercise regimen: walking 3x/week and elliptical machine 2x/week.  Reviewed past Medical, Social and Family history today.  Outpatient Medications Prior to Visit  Medication Sig Dispense Refill   Estradiol-Norethindrone Acet 0.5-0.1 MG tablet Take 1 tablet by mouth daily.     NON FORMULARY every 14 (fourteen) days. Allergy Injections     CALCIUM PO Take by mouth daily. (Patient not taking: Reported on 01/13/2021)     nitrofurantoin, macrocrystal-monohydrate, (MACROBID) 100 MG capsule Take 1 capsule (100 mg total) by mouth 2 (two) times daily. (Patient not taking: Reported on 01/13/2021) 6 capsule 0   No facility-administered medications prior to visit.   ROS See HPI  Objective:  BP 110/80 (BP Location: Left Arm, Patient Position: Sitting, Cuff Size: Normal)   Pulse 76   Temp (!) 97.5 F (36.4 C) (Temporal)   Ht 5' 3.5" (1.613 m)   Wt 148 lb 9.6  oz (67.4 kg)   SpO2 96%   BMI 25.91 kg/m   Physical Exam Cardiovascular:     Rate and Rhythm: Normal rate.     Pulses: Normal pulses.  Pulmonary:     Effort: Pulmonary effort is normal.  Abdominal:     General: Bowel sounds are normal. There is no distension.     Palpations: Abdomen is soft.     Tenderness: There is no abdominal tenderness. There is no guarding.     Hernia: There is no hernia in the left inguinal area or right inguinal area.  Musculoskeletal:        General: Tenderness present. No swelling or signs of injury.     Right hip: Tenderness present. No crepitus. Normal range of motion. Normal strength.     Left hip: Normal.     Right upper leg: Normal.     Right knee: Normal.     Right lower leg: Normal. No edema.     Left lower leg: No edema.  Lymphadenopathy:     Lower Body: No right inguinal adenopathy. No left inguinal adenopathy.  Skin:    General: Skin is warm and dry.     Findings: No rash.  Neurological:     Mental Status: She is alert and oriented to person, place, and time.    Procedure Note :    Procedure : Joint Injection: Right Lateral hip bursa  Indication:  Trochanter Bursitis x 62month  Risks including unsuccessful procedure , bleeding, infection, bruising, skin atrophy, "  steroid flare-up" and others were explained to the patient in detail as well as the benefits. Verbal and written consent was obtained.  The patient was placed in a comfortable position. Lateral approach was used. Skin was prepped with Betadine and alcohol  and anesthetized a cooling spray. Then, a 10cc syringe with a 1.5 inch long 23-gauge needle was used for a joint injection. The needle was advanced  Into the Right lateral hip joint cavity. I aspirated a small amount of intra-articular fluid to confirm correct placement of the needle and injected the joint with 8mL of 2% lidocaine and 40mg  of Depo-Medrol .  Band-Aid was applied.  Tolerated well. Complications: None. Good pain relief  following the procedure.  Postprocedure instructions :   A Band-Aid should be left on for 12 hours. Injection therapy is not a cure itself. It is used in conjunction with other modalities. You can use nonsteroidal anti-inflammatories like naproxen and cold compresses. Rest is recommended in the next 24 hours. You need to report immediately  if fever, chills or any signs of infection develop.   Assessment & Plan:  This visit occurred during the SARS-CoV-2 public health emergency.  Safety protocols were in place, including screening questions prior to the visit, additional usage of staff PPE, and extensive cleaning of exam room while observing appropriate contact time as indicated for disinfecting solutions.   Deborah Spencer was seen today for establish care.  Diagnoses and all orders for this visit:  Trochanteric bursitis of right hip -     naproxen sodium (ANAPROX) 550 MG tablet; Take 1 tablet (550 mg total) by mouth 2 (two) times daily with a meal. -     methylPREDNISolone acetate (DEPO-MEDROL) injection 40 mg She opted to try home exercise for next 6weeks. Start hip exercise on 01/15/2021 Apply cold compression after exercise. Start naproxen tabs on 01/15/21.  Problem List Items Addressed This Visit   None Visit Diagnoses     Trochanteric bursitis of right hip    -  Primary   Relevant Medications   naproxen sodium (ANAPROX) 550 MG tablet   methylPREDNISolone acetate (DEPO-MEDROL) injection 40 mg (Completed)       Follow-up: Return in about 6 weeks (around 02/24/2021) for CPE (fasting).  02/26/2021, NP

## 2021-01-26 DIAGNOSIS — Z1283 Encounter for screening for malignant neoplasm of skin: Secondary | ICD-10-CM | POA: Diagnosis not present

## 2021-01-26 DIAGNOSIS — D225 Melanocytic nevi of trunk: Secondary | ICD-10-CM | POA: Diagnosis not present

## 2021-02-16 ENCOUNTER — Ambulatory Visit: Payer: BC Managed Care – PPO | Admitting: Nurse Practitioner

## 2021-02-24 ENCOUNTER — Encounter: Payer: Self-pay | Admitting: Nurse Practitioner

## 2021-02-24 ENCOUNTER — Ambulatory Visit (INDEPENDENT_AMBULATORY_CARE_PROVIDER_SITE_OTHER): Payer: BC Managed Care – PPO | Admitting: Nurse Practitioner

## 2021-02-24 ENCOUNTER — Other Ambulatory Visit: Payer: Self-pay

## 2021-02-24 VITALS — BP 112/64 | HR 83 | Temp 97.3°F | Resp 18 | Wt 147.2 lb

## 2021-02-24 DIAGNOSIS — Z0000 Encounter for general adult medical examination without abnormal findings: Secondary | ICD-10-CM | POA: Diagnosis not present

## 2021-02-24 DIAGNOSIS — Z13220 Encounter for screening for lipoid disorders: Secondary | ICD-10-CM | POA: Diagnosis not present

## 2021-02-24 DIAGNOSIS — Z23 Encounter for immunization: Secondary | ICD-10-CM

## 2021-02-24 DIAGNOSIS — Z136 Encounter for screening for cardiovascular disorders: Secondary | ICD-10-CM

## 2021-02-24 DIAGNOSIS — Z860100 Personal history of colon polyps, unspecified: Secondary | ICD-10-CM

## 2021-02-24 LAB — LIPID PANEL
Cholesterol: 188 mg/dL (ref 0–200)
HDL: 79.8 mg/dL (ref >39.00)
LDL Cholesterol: 99 mg/dL (ref 0–99)
NonHDL: 108.48
Total CHOL/HDL Ratio: 2
Triglycerides: 45 mg/dL (ref 0.0–149.0)
VLDL: 9 mg/dL (ref 0.0–40.0)

## 2021-02-24 LAB — COMPREHENSIVE METABOLIC PANEL
ALT: 9 U/L (ref 0–35)
AST: 16 U/L (ref 0–37)
Albumin: 4.4 g/dL (ref 3.5–5.2)
Alkaline Phosphatase: 57 U/L (ref 39–117)
BUN: 17 mg/dL (ref 6–23)
CO2: 28 mEq/L (ref 19–32)
Calcium: 9.4 mg/dL (ref 8.4–10.5)
Chloride: 104 mEq/L (ref 96–112)
Creatinine, Ser: 0.85 mg/dL (ref 0.40–1.20)
GFR: 79.27 mL/min (ref >60.00)
Glucose, Bld: 89 mg/dL (ref 70–99)
Potassium: 3.9 mEq/L (ref 3.5–5.1)
Sodium: 141 mEq/L (ref 135–145)
Total Bilirubin: 0.7 mg/dL (ref 0.2–1.2)
Total Protein: 7 g/dL (ref 6.0–8.3)

## 2021-02-24 LAB — CBC WITH DIFFERENTIAL/PLATELET
Basophils Absolute: 0 10*3/uL (ref 0.0–0.1)
Basophils Relative: 0.9 % (ref 0.0–3.0)
Eosinophils Absolute: 0.1 10*3/uL (ref 0.0–0.7)
Eosinophils Relative: 2.1 % (ref 0.0–5.0)
HCT: 39.4 % (ref 36.0–46.0)
Hemoglobin: 13 g/dL (ref 12.0–15.0)
Lymphocytes Relative: 26.7 % (ref 12.0–46.0)
Lymphs Abs: 1.4 10*3/uL (ref 0.7–4.0)
MCHC: 32.9 g/dL (ref 30.0–36.0)
MCV: 91.7 fl (ref 78.0–100.0)
Monocytes Absolute: 0.5 10*3/uL (ref 0.1–1.0)
Monocytes Relative: 9.4 % (ref 3.0–12.0)
Neutro Abs: 3.3 10*3/uL (ref 1.4–7.7)
Neutrophils Relative %: 60.9 % (ref 43.0–77.0)
Platelets: 239 10*3/uL (ref 150.0–400.0)
RBC: 4.3 Mil/uL (ref 3.87–5.11)
RDW: 13 % (ref 11.5–15.5)
WBC: 5.4 10*3/uL (ref 4.0–10.5)

## 2021-02-24 LAB — TSH: TSH: 3.8 u[IU]/mL (ref 0.35–5.50)

## 2021-02-24 NOTE — Progress Notes (Signed)
Subjective:    Patient ID: Deborah Spencer, female    DOB: [birth year], 50 y.o.   MRN: [id number]  Patient presents today for CPE   HPI Hx of colonic polyp Colonoscopy completed 08/2020 by Dr. Loreta Ave precancerous polyp removed per patient  repeat in 3years. Report requested  Vision:will schedule, hx of lasix surgery Dental:up to date Diet:regular Exercise:daily walking Weight:  Wt Readings from Last 3 Encounters:  02/24/21 147 lb 3.2 oz (66.8 kg)  01/13/21 148 lb 9.6 oz (67.4 kg)  07/15/14 138 lb 8 oz (62.8 kg)    Sexual History (orientation,birth control, marital status, STD):married, no need for STD screen, pelvic and breast exam done by GYN per patient  Depression/Suicide: Depression screen [facility] 2/9 01/13/2021  Decreased Interest 0  Down, Depressed, Hopeless 0  PHQ - 2 Score 0  Altered sleeping 1  Tired, decreased energy 1  Change in appetite 0  Feeling bad or failure about yourself  0  Trouble concentrating 0  Moving slowly or fidgety/restless 0  Suicidal thoughts 0  PHQ-9 Score 2  Difficult doing work/chores Not difficult at all   Immunizations: (TDAP, Hep C screen, Pneumovax, Influenza, zoster)  Health Maintenance  Topic Date Due   Hepatitis C Screening: USPSTF Recommendation to screen - Ages 40-79 yo.  Never done   Colon Cancer Screening  Never done   Mammogram  12/14/2018   Tetanus Vaccine  06/28/2019   Zoster (Shingles) Vaccine (1 of 2) Never done   Pap Smear  12/13/2020   COVID-19 Vaccine (5 - Booster for Pfizer series) 02/02/2021   HIV Screening  02/24/2022*   Flu Shot  Completed   Pneumococcal Vaccination  Aged Out   HPV Vaccine  Aged Out  *Topic was postponed. The date shown is not the original due date.   Fall Risk: Fall Risk  02/24/2021 01/13/2021  Falls in the past year? 0 0  Number falls in past yr: 0 0  Injury with Fall? 0 0  Risk for fall due to : - No Fall Risks  Follow up - Falls evaluation completed   Medications and allergies  reviewed with patient and updated if appropriate.  Patient Active Problem List   Diagnosis Date Noted   Hx of colonic polyp 02/24/2021   Abnormal liver function tests 01/13/2021   Menopausal symptom 01/13/2021   Plantar wart 08/28/2012   EYE SURGERY, HX OF 09/19/2006    Current Outpatient Medications on File Prior to Visit  Medication Sig Dispense Refill   cetirizine (ZYRTEC) 10 MG tablet Take 10 mg by mouth daily.     Estradiol-Norethindrone Acet 0.5-0.1 MG tablet Take 1 tablet by mouth daily.     naproxen sodium (ANAPROX) 550 MG tablet Take 1 tablet (550 mg total) by mouth 2 (two) times daily with a meal. (Patient not taking: Reported on 02/24/2021) 30 tablet 0   NON FORMULARY every 14 (fourteen) days. Allergy Injections (Patient not taking: Reported on 02/24/2021)     No current facility-administered medications on file prior to visit.    Past Medical History:  Diagnosis Date   History of postmenopausal bleeding    Shingles 06/2012   hx of    Past Surgical History:  Procedure Laterality Date   CESAREAN SECTION     x 3    ENDOMETRIAL BIOPSY  02/20/2016   LASIK      Social History   Socioeconomic History   Marital status: Married    Spouse name: Not on file  Number of children: 3   Years of education: Not on file   Highest education level: Not on file  Occupational History   Occupation: subs- Administrator, arts: [employer]  Tobacco Use   Smoking status: Never   Smokeless tobacco: Never  Vaping Use   Vaping Use: Never used  Substance and Sexual Activity   Alcohol use: Yes    Comment: socially    Drug use: No   Sexual activity: Not on file  Other Topics Concern   Not on file  Social History Narrative   Not on file   Social Determinants of Health   Financial Resource Strain: Not on file  Food Insecurity: Not on file  Transportation Needs: Not on file  Physical Activity: Not on file  Stress: Not on file  Social Connections: Not on file     History reviewed. No pertinent family history.      Review of Systems  Constitutional:  Negative for fever, malaise/fatigue and weight loss.  HENT:  Negative for congestion and sore throat.   Eyes:        Negative for visual changes  Respiratory:  Negative for cough and shortness of breath.   Cardiovascular:  Negative for chest pain, palpitations and leg swelling.  Gastrointestinal:  Negative for blood in stool, constipation, diarrhea and heartburn.  Genitourinary:  Negative for dysuria, frequency and urgency.  Musculoskeletal:  Negative for falls, joint pain and myalgias.  Skin:  Negative for rash.  Neurological:  Negative for dizziness, sensory change and headaches.  Endo/Heme/Allergies:  Does not bruise/bleed easily.  Psychiatric/Behavioral:  Negative for depression, hallucinations, substance abuse and suicidal ideas. The patient is not nervous/anxious and does not have insomnia.    Objective:   Vitals:   02/24/21 0818  BP: 112/64  Pulse: 83  Resp: 18  Temp: (!) 97.3 F (36.3 C)  SpO2: 98%    Body mass index is 25.67 kg/m.   Physical Examination:  Physical Exam Vitals reviewed.  Constitutional:      General: She is not in acute distress.    Appearance: She is well-developed.  HENT:     Right Ear: Tympanic membrane, ear canal and external ear normal.     Left Ear: Tympanic membrane, ear canal and external ear normal.  Eyes:     Extraocular Movements: Extraocular movements intact.     Conjunctiva/sclera: Conjunctivae normal.  Cardiovascular:     Rate and Rhythm: Normal rate and regular rhythm.     Pulses: Normal pulses.     Heart sounds: Normal heart sounds.  Pulmonary:     Effort: Pulmonary effort is normal. No respiratory distress.     Breath sounds: Normal breath sounds.  Chest:     Chest wall: No tenderness.  Abdominal:     General: Bowel sounds are normal.     Palpations: Abdomen is soft.  Genitourinary:    Comments: Breast and pelvic exam  deferred to GYN Musculoskeletal:        General: Normal range of motion.     Cervical back: Normal range of motion and neck supple.     Right lower leg: No edema.     Left lower leg: No edema.  Lymphadenopathy:     Cervical: No cervical adenopathy.  Skin:    General: Skin is warm and dry.  Neurological:     Mental Status: She is alert and oriented to person, place, and time.     Gait: Gait normal.  Deep Tendon Reflexes: Reflexes are normal and symmetric. Reflexes normal.  Psychiatric:        Mood and Affect: Mood normal.        Behavior: Behavior normal.        Thought Content: Thought content normal.    ASSESSMENT and PLAN: This visit occurred during the SARS-CoV-2 public health emergency.  Safety protocols were in place, including screening questions prior to the visit, additional usage of staff PPE, and extensive cleaning of exam room while observing appropriate contact time as indicated for disinfecting solutions.   Deborah Spencer was seen today for annual exam.  Diagnoses and all orders for this visit:  Preventative health care -     Flu Vaccine QUAD 6+ mos PF IM (Fluarix Quad PF) -     Comprehensive metabolic panel -     CBC with Differential/Platelet -     Lipid panel -     TSH  Encounter for lipid screening for cardiovascular disease -     Lipid panel  Hx of colonic polyp     Problem List Items Addressed This Visit       Other   Hx of colonic polyp    Colonoscopy completed 08/2020 by Dr. Loreta Ave precancerous polyp removed per patient  repeat in 3years. Report requested      Other Visit Diagnoses     Preventative health care    -  Primary   Relevant Orders   Flu Vaccine QUAD 6+ mos PF IM (Fluarix Quad PF) (Completed)   Comprehensive metabolic panel   CBC with Differential/Platelet   Lipid panel   TSH   Encounter for lipid screening for cardiovascular disease       Relevant Orders   Lipid panel       Follow up: Return in about 1 year (around  02/24/2022) for CPE (fasting).  Alysia Penna, NP

## 2021-02-24 NOTE — Assessment & Plan Note (Signed)
Colonoscopy completed 08/2020 by Dr. Loreta Ave precancerous polyp removed per patient  repeat in 3years. Report requested

## 2021-02-24 NOTE — Patient Instructions (Addendum)
Go to lab for blood draw  Sign medical release to get colonoscopy report from Dr. Collene Mares.  Please have PAP amd mammogram results faxed to me once completed.  Preventive Care 64-51 Years Old, Female Preventive care refers to lifestyle choices and visits with your health care provider that can promote health and wellness. This includes: A yearly physical exam. This is also called an annual wellness visit. Regular dental and eye exams. Immunizations. Screening for certain conditions. Healthy lifestyle choices, such as: Eating a healthy diet. Getting regular exercise. Not using drugs or products that contain nicotine and tobacco. Limiting alcohol use. What can I expect for my preventive care visit? Physical exam Your health care provider will check your: Height and weight. These may be used to calculate your BMI (body mass index). BMI is a measurement that tells if you are at a healthy weight. Heart rate and blood pressure. Body temperature. Skin for abnormal spots. Counseling Your health care provider may ask you questions about your: Past medical problems. Family's medical history. Alcohol, tobacco, and drug use. Emotional well-being. Home life and relationship well-being. Sexual activity. Diet, exercise, and sleep habits. Work and work Statistician. Access to firearms. Method of birth control. Menstrual cycle. Pregnancy history. What immunizations do I need? Vaccines are usually given at various ages, according to a schedule. Your health care provider will recommend vaccines for you based on your age, medical history, and lifestyle or other factors, such as travel or where you work. What tests do I need? Blood tests Lipid and cholesterol levels. These may be checked every 5 years, or more often if you are over 51 years old. Hepatitis C test. Hepatitis B test. Screening Lung cancer screening. You may have this screening every year starting at age 61 if you have a 30-pack-year  history of smoking and currently smoke or have quit within the past 15 years. Colorectal cancer screening. All adults should have this screening starting at age 60 and continuing until age 4. Your health care provider may recommend screening at age 73 if you are at increased risk. You will have tests every 1-10 years, depending on your results and the type of screening test. Diabetes screening. This is done by checking your blood sugar (glucose) after you have not eaten for a while (fasting). You may have this done every 1-3 years. Mammogram. This may be done every 1-2 years. Talk with your health care provider about when you should start having regular mammograms. This may depend on whether you have a family history of breast cancer. BRCA-related cancer screening. This may be done if you have a family history of breast, ovarian, tubal, or peritoneal cancers. Pelvic exam and Pap test. This may be done every 3 years starting at age 45. Starting at age 31, this may be done every 5 years if you have a Pap test in combination with an HPV test. Other tests STD (sexually transmitted disease) testing, if you are at risk. Bone density scan. This is done to screen for osteoporosis. You may have this scan if you are at high risk for osteoporosis. Talk with your health care provider about your test results, treatment options, and if necessary, the need for more tests. Follow these instructions at home: Eating and drinking  Eat a diet that includes fresh fruits and vegetables, whole grains, lean protein, and low-fat dairy products. Take vitamin and mineral supplements as recommended by your health care provider. Do not drink alcohol if: Your health care provider tells you  not to drink. You are pregnant, may be pregnant, or are planning to become pregnant. If you drink alcohol: Limit how much you have to 0-1 drink a day. Be aware of how much alcohol is in your drink. In the [place] one drink equals  one 12 oz bottle of beer (355 mL), one 5 oz glass of wine (148 mL), or one 1 oz glass of hard liquor (44 mL). Lifestyle Take daily care of your teeth and gums. Brush your teeth every morning and night with fluoride toothpaste. Floss one time each day. Stay active. Exercise for at least 30 minutes 5 or more days each week. Do not use any products that contain nicotine or tobacco, such as cigarettes, e-cigarettes, and chewing tobacco. If you need help quitting, ask your health care provider. Do not use drugs. If you are sexually active, practice safe sex. Use a condom or other form of protection to prevent STIs (sexually transmitted infections). If you do not wish to become pregnant, use a form of birth control. If you plan to become pregnant, see your health care provider for a prepregnancy visit. If told by your health care provider, take low-dose aspirin daily starting at age 22. Find healthy ways to cope with stress, such as: Meditation, yoga, or listening to music. Journaling. Talking to a trusted person. Spending time with friends and family. Safety Always wear your seat belt while driving or riding in a vehicle. Do not drive: If you have been drinking alcohol. Do not ride with someone who has been drinking. When you are tired or distracted. While texting. Wear a helmet and other protective equipment during sports activities. If you have firearms in your house, make sure you follow all gun safety procedures. What's next? Visit your health care provider once a year for an annual wellness visit. Ask your health care provider how often you should have your eyes and teeth checked. Stay up to date on all vaccines. This information is not intended to replace advice given to you by your health care provider. Make sure you discuss any questions you have with your health care provider. Document Revised: 06/27/2020 Document Reviewed: 12/29/2017 Elsevier Patient Education  2022 [employer].

## 2021-02-26 ENCOUNTER — Encounter: Payer: Self-pay | Admitting: Nurse Practitioner

## 2021-03-31 ENCOUNTER — Ambulatory Visit: Payer: BC Managed Care – PPO | Admitting: Nurse Practitioner

## 2021-05-20 DIAGNOSIS — Z01419 Encounter for gynecological examination (general) (routine) without abnormal findings: Secondary | ICD-10-CM | POA: Diagnosis not present

## 2021-05-20 DIAGNOSIS — Z1151 Encounter for screening for human papillomavirus (HPV): Secondary | ICD-10-CM | POA: Diagnosis not present

## 2021-05-20 DIAGNOSIS — Z124 Encounter for screening for malignant neoplasm of cervix: Secondary | ICD-10-CM | POA: Diagnosis not present

## 2021-05-20 DIAGNOSIS — Z1389 Encounter for screening for other disorder: Secondary | ICD-10-CM | POA: Diagnosis not present

## 2021-05-20 DIAGNOSIS — Z1231 Encounter for screening mammogram for malignant neoplasm of breast: Secondary | ICD-10-CM | POA: Diagnosis not present

## 2021-05-20 LAB — HM MAMMOGRAPHY

## 2021-05-20 LAB — RESULTS CONSOLE HPV: CHL HPV: NEGATIVE

## 2021-05-20 LAB — HM PAP SMEAR

## 2021-05-21 ENCOUNTER — Ambulatory Visit: Payer: BC Managed Care – PPO | Admitting: Nurse Practitioner

## 2021-06-26 ENCOUNTER — Ambulatory Visit: Payer: BC Managed Care – PPO | Admitting: Nurse Practitioner

## 2021-10-30 ENCOUNTER — Telehealth: Payer: Self-pay

## 2021-11-04 NOTE — Telephone Encounter (Signed)
Call to help schedule overdue mammogram. VM instructions left for pt to call our office. 

## 2022-01-26 DIAGNOSIS — L821 Other seborrheic keratosis: Secondary | ICD-10-CM | POA: Diagnosis not present

## 2022-01-26 DIAGNOSIS — Z1283 Encounter for screening for malignant neoplasm of skin: Secondary | ICD-10-CM | POA: Diagnosis not present

## 2022-02-26 ENCOUNTER — Encounter: Payer: BC Managed Care – PPO | Admitting: Nurse Practitioner

## 2022-04-08 ENCOUNTER — Ambulatory Visit (INDEPENDENT_AMBULATORY_CARE_PROVIDER_SITE_OTHER): Payer: BC Managed Care – PPO | Admitting: Nurse Practitioner

## 2022-04-08 ENCOUNTER — Encounter: Payer: Self-pay | Admitting: Nurse Practitioner

## 2022-04-08 VITALS — BP 130/82 | HR 67 | Temp 96.6°F | Ht 63.5 in | Wt 150.0 lb

## 2022-04-08 DIAGNOSIS — Z136 Encounter for screening for cardiovascular disorders: Secondary | ICD-10-CM | POA: Diagnosis not present

## 2022-04-08 DIAGNOSIS — Z13220 Encounter for screening for lipoid disorders: Secondary | ICD-10-CM | POA: Diagnosis not present

## 2022-04-08 DIAGNOSIS — K589 Irritable bowel syndrome without diarrhea: Secondary | ICD-10-CM | POA: Insufficient documentation

## 2022-04-08 DIAGNOSIS — N951 Menopausal and female climacteric states: Secondary | ICD-10-CM

## 2022-04-08 DIAGNOSIS — Z0001 Encounter for general adult medical examination with abnormal findings: Secondary | ICD-10-CM | POA: Diagnosis not present

## 2022-04-08 DIAGNOSIS — K219 Gastro-esophageal reflux disease without esophagitis: Secondary | ICD-10-CM | POA: Insufficient documentation

## 2022-04-08 LAB — COMPREHENSIVE METABOLIC PANEL
ALT: 10 U/L (ref 0–35)
AST: 16 U/L (ref 0–37)
Albumin: 4.8 g/dL (ref 3.5–5.2)
Alkaline Phosphatase: 65 U/L (ref 39–117)
BUN: 14 mg/dL (ref 6–23)
CO2: 29 mEq/L (ref 19–32)
Calcium: 9.5 mg/dL (ref 8.4–10.5)
Chloride: 102 mEq/L (ref 96–112)
Creatinine, Ser: 0.84 mg/dL (ref 0.40–1.20)
GFR: 79.77 mL/min (ref >60.00)
Glucose, Bld: 88 mg/dL (ref 70–99)
Potassium: 4.1 mEq/L (ref 3.5–5.1)
Sodium: 139 mEq/L (ref 135–145)
Total Bilirubin: 0.5 mg/dL (ref 0.2–1.2)
Total Protein: 7.5 g/dL (ref 6.0–8.3)

## 2022-04-08 LAB — LIPID PANEL
Cholesterol: 196 mg/dL (ref 0–200)
HDL: 79.2 mg/dL (ref >39.00)
LDL Cholesterol: 108 mg/dL — ABNORMAL HIGH (ref 0–99)
NonHDL: 117.02
Total CHOL/HDL Ratio: 2
Triglycerides: 46 mg/dL (ref 0.0–149.0)
VLDL: 9.2 mg/dL (ref 0.0–40.0)

## 2022-04-08 NOTE — Patient Instructions (Addendum)
Go to lab  Try melatonin 20m with magnesium or melatonin160mwith chamomile or estroven supplement (complete or menopause/mood relief). Allow 8hrs of sleep when you use a sleep aid Minimize caffeine intake to 1cup daily.  Have PAP and mammogram reports faxxed to me Schedule appt with ophthalmologist.  Preventive Care 3945425ears Old, Female Preventive care refers to lifestyle choices and visits with your health care provider that can promote health and wellness. Preventive care visits are also called wellness exams. What can I expect for my preventive care visit? Counseling Your health care provider may ask you questions about your: Medical history, including: Past medical problems. Family medical history. Pregnancy history. Current health, including: Menstrual cycle. Method of birth control. Emotional well-being. Home life and relationship well-being. Sexual activity and sexual health. Lifestyle, including: Alcohol, nicotine or tobacco, and drug use. Access to firearms. Diet, exercise, and sleep habits. Work and work enStatisticianSunscreen use. Safety issues such as seatbelt and bike helmet use. Physical exam Your health care provider will check your: Height and weight. These may be used to calculate your BMI (body mass index). BMI is a measurement that tells if you are at a healthy weight. Waist circumference. This measures the distance around your waistline. This measurement also tells if you are at a healthy weight and may help predict your risk of certain diseases, such as type 2 diabetes and high blood pressure. Heart rate and blood pressure. Body temperature. Skin for abnormal spots. What immunizations do I need?  Vaccines are usually given at various ages, according to a schedule. Your health care provider will recommend vaccines for you based on your age, medical history, and lifestyle or other factors, such as travel or where you work. What tests do I  need? Screening Your health care provider may recommend screening tests for certain conditions. This may include: Lipid and cholesterol levels. Diabetes screening. This is done by checking your blood sugar (glucose) after you have not eaten for a while (fasting). Pelvic exam and Pap test. Hepatitis B test. Hepatitis C test. HIV (human immunodeficiency virus) test. STI (sexually transmitted infection) testing, if you are at risk. Lung cancer screening. Colorectal cancer screening. Mammogram. Talk with your health care provider about when you should start having regular mammograms. This may depend on whether you have a family history of breast cancer. BRCA-related cancer screening. This may be done if you have a family history of breast, ovarian, tubal, or peritoneal cancers. Bone density scan. This is done to screen for osteoporosis. Talk with your health care provider about your test results, treatment options, and if necessary, the need for more tests. Follow these instructions at home: Eating and drinking  Eat a diet that includes fresh fruits and vegetables, whole grains, lean protein, and low-fat dairy products. Take vitamin and mineral supplements as recommended by your health care provider. Do not drink alcohol if: Your health care provider tells you not to drink. You are pregnant, may be pregnant, or are planning to become pregnant. If you drink alcohol: Limit how much you have to 0-1 drink a day. Know how much alcohol is in your drink. In the [place] one drink equals one 12 oz bottle of beer (355 mL), one 5 oz glass of wine (148 mL), or one 1 oz glass of hard liquor (44 mL). Lifestyle Brush your teeth every morning and night with fluoride toothpaste. Floss one time each day. Exercise for at least 30 minutes 5 or more days each week. Do not use  any products that contain nicotine or tobacco. These products include cigarettes, chewing tobacco, and vaping devices, such as  e-cigarettes. If you need help quitting, ask your health care provider. Do not use drugs. If you are sexually active, practice safe sex. Use a condom or other form of protection to prevent STIs. If you do not wish to become pregnant, use a form of birth control. If you plan to become pregnant, see your health care provider for a prepregnancy visit. Take aspirin only as told by your health care provider. Make sure that you understand how much to take and what form to take. Work with your health care provider to find out whether it is safe and beneficial for you to take aspirin daily. Find healthy ways to manage stress, such as: Meditation, yoga, or listening to music. Journaling. Talking to a trusted person. Spending time with friends and family. Minimize exposure to UV radiation to reduce your risk of skin cancer. Safety Always wear your seat belt while driving or riding in a vehicle. Do not drive: If you have been drinking alcohol. Do not ride with someone who has been drinking. When you are tired or distracted. While texting. If you have been using any mind-altering substances or drugs. Wear a helmet and other protective equipment during sports activities. If you have firearms in your house, make sure you follow all gun safety procedures. Seek help if you have been physically or sexually abused. What's next? Visit your health care provider once a year for an annual wellness visit. Ask your health care provider how often you should have your eyes and teeth checked. Stay up to date on all vaccines. This information is not intended to replace advice given to you by your health care provider. Make sure you discuss any questions you have with your health care provider. Document Revised: 10/15/2020 Document Reviewed: 10/15/2020 Elsevier Patient Education  [place].

## 2022-04-08 NOTE — Progress Notes (Signed)
Complete physical exam  Patient: Deborah Spencer   DOB: 1970/08/09   51 y.o. Female  MRN: [id number] Visit Date: 04/08/2022  Subjective:    Chief Complaint  Patient presents with   Annual Exam    CPE Pt fasting  Requesting records for pap & mammo   Deborah Spencer is a 51 y.o. female who presents today for a complete physical exam. She reports consuming a low fat and low sodium diet.  Exercise 2-3x/week  She generally feels well. She reports sleeping poorly. She does have additional problems to discuss today.  Vision:No Dental:Yes STD Screen:No  BP Readings from Last 3 Encounters:  04/08/22 130/82  02/24/21 112/64  01/13/21 110/80    Wt Readings from Last 3 Encounters:  04/08/22 150 lb (68 kg)  02/24/21 147 lb 3.2 oz (66.8 kg)  01/13/21 148 lb 9.6 oz (67.4 kg)    Most recent fall risk assessment:    02/24/2021    8:19 AM  Fall Risk   Falls in the past year? 0  Number falls in past yr: 0  Injury with Fall? 0     Depression screen:Yes - No Depression  Most recent depression screenings:    04/08/2022   10:57 AM 01/13/2021    2:25 PM  PHQ 2/9 Scores  PHQ - 2 Score 0 0  PHQ- 9 Score 4 2    HPI  Gastroesophageal reflux disease Controlled with diet modification. Use of antacid prn  Insomnia associated with menopause Onset 1year ago while weaning off HRT. She has been off HRT x 2weeks now. Difficulty with staying asleep, bedtine 9-10pm, easily falls asleep,  sleeps about  3-4hrs per night then wakes up about 1-2am due to hot flash, unable to return to sleep. previous use of tylenol PM, caused daytime somnolence,  no improvement with melatonin Caffeine intake: 2cup in AM and diet soda with lunch. ETOH: 1-2glasses 2-3x/week.  Advised to try melatonin 10mg  with magnesium or melatonin 10mg  with chamomile or estroven supplement (complete or menopause/mood relief). Allow 8hrs of sleep when you use a sleep aid Minimize caffeine intake to 1cup daily.   Past  Medical History:  Diagnosis Date   Abnormal liver function tests 01/13/2021   History of postmenopausal bleeding    Shingles 06/2012   hx of   Past Surgical History:  Procedure Laterality Date   CESAREAN SECTION     x 3    ENDOMETRIAL BIOPSY  02/20/2016   LASIK     Social History   Socioeconomic History   Marital status: Married    Spouse name: Not on file   Number of children: 3   Years of education: Not on file   Highest education level: Not on file  Occupational History   Occupation: subs- Associate Professor: [employer]  Tobacco Use   Smoking status: Never   Smokeless tobacco: Never  Vaping Use   Vaping Use: Never used  Substance and Sexual Activity   Alcohol use: Yes    Comment: socially    Drug use: No   Sexual activity: Not on file  Other Topics Concern   Not on file  Social History Narrative   Not on file   Social Determinants of Health   Financial Resource Strain: Not on file  Food Insecurity: Not on file  Transportation Needs: Not on file  Physical Activity: Not on file  Stress: Not on file  Social Connections: Not on file  Intimate Partner  Violence: Not on file   No family status information on file.   History reviewed. No pertinent family history. Allergies  Allergen Reactions   Iodinated Contrast Media    Iohexol      Code: RASH, Desc: PT IS ALLERGIC TO IVP DYE 05/26/06/RM., Onset Date: [id number]     Patient Care Team: Adeli Frost, Bonna Gains, NP as PCP - General (Internal Medicine) Huel Cote, MD as Consulting Physician (Obstetrics and Gynecology) Eileen Stanford, MD as Referring Physician (Allergy and Immunology)   Medications: Outpatient Medications Prior to Visit  Medication Sig   cetirizine (ZYRTEC) 10 MG tablet Take 10 mg by mouth daily.   fluticasone (FLONASE) 50 MCG/ACT nasal spray Place 2 sprays into both nostrils daily.   [DISCONTINUED] Estradiol-Norethindrone Acet 0.5-0.1 MG tablet Take 1 tablet by mouth daily.  (Patient not taking: Reported on 04/08/2022)   [DISCONTINUED] naproxen sodium (ANAPROX) 550 MG tablet Take 1 tablet (550 mg total) by mouth 2 (two) times daily with a meal. (Patient not taking: Reported on 02/24/2021)   [DISCONTINUED] NON FORMULARY every 14 (fourteen) days. Allergy Injections (Patient not taking: Reported on 02/24/2021)   No facility-administered medications prior to visit.    Review of Systems  Constitutional:  Negative for fever.  HENT:  Negative for congestion and sore throat.   Eyes:        Negative for visual changes  Respiratory:  Negative for cough and shortness of breath.   Cardiovascular:  Negative for chest pain, palpitations and leg swelling.  Gastrointestinal:  Negative for blood in stool, constipation and diarrhea.  Genitourinary:  Negative for dysuria, frequency and urgency.  Musculoskeletal:  Negative for myalgias.  Skin:  Negative for rash.  Neurological:  Negative for dizziness and headaches.  Hematological:  Does not bruise/bleed easily.  Psychiatric/Behavioral:  Positive for sleep disturbance. Negative for agitation, behavioral problems, decreased concentration, dysphoric mood and suicidal ideas. The patient is not nervous/anxious and is not hyperactive.         Objective:  BP 130/82 (BP Location: Right Arm, Patient Position: Sitting, Cuff Size: Normal)   Pulse 67   Temp (!) 96.6 F (35.9 C) (Temporal)   Ht 5' 3.5" (1.613 m)   Wt 150 lb (68 kg)   SpO2 97%   BMI 26.15 kg/m     Physical Exam Vitals reviewed.  Constitutional:      General: She is not in acute distress.    Appearance: She is well-developed.  HENT:     Right Ear: Tympanic membrane, ear canal and external ear normal.     Left Ear: Tympanic membrane, ear canal and external ear normal.     Nose: Nose normal.  Eyes:     Extraocular Movements: Extraocular movements intact.     Conjunctiva/sclera: Conjunctivae normal.     Pupils: Pupils are equal, round, and reactive to light.   Cardiovascular:     Rate and Rhythm: Normal rate and regular rhythm.     Heart sounds: Normal heart sounds.  Pulmonary:     Effort: Pulmonary effort is normal. No respiratory distress.     Breath sounds: Normal breath sounds.  Chest:     Chest wall: No tenderness.  Abdominal:     General: Bowel sounds are normal.     Palpations: Abdomen is soft.  Genitourinary:    Comments: Deferred breast and pelvic exam to GYN Musculoskeletal:        General: Normal range of motion.     Cervical back: Normal range of motion  and neck supple.     Right lower leg: No edema.     Left lower leg: No edema.  Lymphadenopathy:     Cervical: No cervical adenopathy.  Skin:    General: Skin is warm and dry.  Neurological:     Mental Status: She is alert and oriented to person, place, and time.     Deep Tendon Reflexes: Reflexes are normal and symmetric.  Psychiatric:        Mood and Affect: Mood normal.        Behavior: Behavior normal.        Thought Content: Thought content normal.      Results for orders placed or performed in visit on 04/08/22  HM MAMMOGRAPHY  Result Value Ref Range   HM Mammogram 0-4 Bi-Rad 0-4 Bi-Rad, Self Reported Normal      Assessment & Plan:    Routine Health Maintenance and Physical Exam  Immunization History  Administered Date(s) Administered   Influenza Split 01/31/2013   Influenza,inj,Quad PF,6+ Mos 02/11/2020, 02/24/2021   Influenza-Unspecified 02/25/2015, 01/31/2017, 01/29/2019, 03/07/2022   PFIZER(Purple Top)SARS-COV-2 Vaccination 06/30/2019, 07/21/2019, 03/15/2020, 12/08/2020   Td 05/03/1997, 06/27/2009    Health Maintenance  Topic Date Due   HIV Screening  Never done   Hepatitis C Screening  Never done   COLONOSCOPY (Pts 45-43yrs Insurance coverage will need to be confirmed)  08/27/2021   COVID-19 Vaccine (5 - 2023-24 season) 04/24/2022 (Originally 01/01/2022)   Zoster Vaccines- Shingrix (1 of 2) 07/08/2022 (Originally 08/17/2019)   MAMMOGRAM   05/20/2022   PAP SMEAR-Modifier  05/20/2024   INFLUENZA VACCINE  Completed   HPV VACCINES  Aged Out   DTaP/Tdap/Td  Discontinued   Discussed health benefits of physical activity, and encouraged her to engage in regular exercise appropriate for her age and condition.  Problem List Items Addressed This Visit       Other   Insomnia associated with menopause    Onset 1year ago while weaning off HRT. She has been off HRT x 2weeks now. Difficulty with staying asleep, bedtine 9-10pm, easily falls asleep,  sleeps about  3-4hrs per night then wakes up about 1-2am due to hot flash, unable to return to sleep. previous use of tylenol PM, caused daytime somnolence,  no improvement with melatonin Caffeine intake: 2cup in AM and diet soda with lunch. ETOH: 1-2glasses 2-3x/week.  Advised to try melatonin 10mg  with magnesium or melatonin 10mg  with chamomile or estroven supplement (complete or menopause/mood relief). Allow 8hrs of sleep when you use a sleep aid Minimize caffeine intake to 1cup daily.      Other Visit Diagnoses     Encounter for preventative adult health care exam with abnormal findings    -  Primary   Relevant Orders   Lipid panel   Comprehensive metabolic panel   Encounter for lipid screening for cardiovascular disease       Relevant Orders   Lipid panel      Return in about 1 year (around 04/09/2023) for CPE (fasting).     Wilfred Lacy, NP

## 2022-04-08 NOTE — Assessment & Plan Note (Addendum)
Controlled with diet modification. Use of antacid prn

## 2022-04-08 NOTE — Assessment & Plan Note (Signed)
Onset 1year ago while weaning off HRT. She has been off HRT x 2weeks now. Difficulty with staying asleep, bedtine 9-10pm, easily falls asleep,  sleeps about  3-4hrs per night then wakes up about 1-2am due to hot flash, unable to return to sleep. previous use of tylenol PM, caused daytime somnolence,  no improvement with melatonin Caffeine intake: 2cup in AM and diet soda with lunch. ETOH: 1-2glasses 2-3x/week.  Advised to try melatonin 10mg  with magnesium or melatonin 10mg  with chamomile or estroven supplement (complete or menopause/mood relief). Allow 8hrs of sleep when you use a sleep aid Minimize caffeine intake to 1cup daily.

## 2022-06-24 ENCOUNTER — Ambulatory Visit (INDEPENDENT_AMBULATORY_CARE_PROVIDER_SITE_OTHER): Payer: BC Managed Care – PPO | Admitting: Nurse Practitioner

## 2022-06-24 ENCOUNTER — Encounter: Payer: Self-pay | Admitting: Nurse Practitioner

## 2022-06-24 VITALS — BP 122/80 | HR 72 | Temp 97.6°F | Ht 63.5 in | Wt 155.0 lb

## 2022-06-24 DIAGNOSIS — R309 Painful micturition, unspecified: Secondary | ICD-10-CM

## 2022-06-24 DIAGNOSIS — N3001 Acute cystitis with hematuria: Secondary | ICD-10-CM

## 2022-06-24 LAB — POC URINALSYSI DIPSTICK (AUTOMATED)
Bilirubin, UA: POSITIVE
Blood, UA: POSITIVE
Glucose, UA: NEGATIVE
Ketones, UA: NEGATIVE
Nitrite, UA: POSITIVE
Protein, UA: NEGATIVE
Spec Grav, UA: 1.015 (ref 1.010–1.025)
Urobilinogen, UA: 2 E.U./dL — AB
pH, UA: 6 (ref 5.0–8.0)

## 2022-06-24 MED ORDER — NITROFURANTOIN MONOHYD MACRO 100 MG PO CAPS: 100.0000 mg | ORAL_CAPSULE | Freq: Two times a day (BID) | ORAL | 0 refills | Status: DC

## 2022-06-24 MED ORDER — FLUCONAZOLE 150 MG PO TABS: ORAL_TABLET | ORAL | 0 refills | Status: DC

## 2022-06-24 NOTE — Patient Instructions (Signed)
It was great to see you!  Start macrobid twice a day for 5 days  I have sent in diflucan to prevent yeast. Take 1 tablet after finishing the antibiotic and a second one 3 days later if needed.   Drink plenty of water.   Let's follow-up if your symptoms worsen or don't improve.   Take care,  Vance Peper, NP

## 2022-06-24 NOTE — Progress Notes (Signed)
Acute Office Visit  Subjective:     Patient ID: Deborah Spencer, female    DOB: 1971/04/27, 52 y.o.   MRN: [id number]  Chief Complaint  Patient presents with   Urinary Tract Infection    Frequency and pain since Monday and taking OTC meds     HPI Patient is in today for urinary frequency for the last 4 days.   URINARY SYMPTOMS  Dysuria: no Urinary frequency: yes Urgency: yes Small volume voids: yes Symptom severity:  moderate Urinary incontinence: no Foul odor: no Hematuria: no Abdominal pain: no Back pain: no Suprapubic pain/pressure: yes Flank pain: no Fever:  no Vomiting: no Relief with cranberry juice:  n/a Relief with pyridium: no Status: stable Previous urinary tract infection: yes Recurrent urinary tract infection: no Treatments attempted:  azo and increasing fluids   ROS See pertinent positives and negatives per HPI.     Objective:    BP 122/80 (BP Location: Right Arm)   Pulse 72   Temp 97.6 F (36.4 C)   Ht 5' 3.5" (1.613 m)   Wt 155 lb (70.3 kg)   LMP 06/30/2014   SpO2 99%   BMI 27.03 kg/m    Physical Exam Vitals and nursing note reviewed.  Constitutional:      General: She is not in acute distress.    Appearance: Normal appearance.  HENT:     Head: Normocephalic.  Eyes:     Conjunctiva/sclera: Conjunctivae normal.  Pulmonary:     Effort: Pulmonary effort is normal.  Abdominal:     General: There is no distension.     Palpations: Abdomen is soft.     Tenderness: There is no abdominal tenderness. There is no right CVA tenderness or left CVA tenderness.  Musculoskeletal:     Cervical back: Normal range of motion.  Skin:    General: Skin is warm.  Neurological:     General: No focal deficit present.     Mental Status: She is alert and oriented to person, place, and time.  Psychiatric:        Mood and Affect: Mood normal.        Behavior: Behavior normal.        Thought Content: Thought content normal.        Judgment:  Judgment normal.     Results for orders placed or performed in visit on 06/24/22  POCT Urinalysis Dipstick (Automated)  Result Value Ref Range   Color, UA Orange    Clarity, UA Cloudy    Glucose, UA Negative Negative   Bilirubin, UA Positive    Ketones, UA Negative    Spec Grav, UA 1.015 1.010 - 1.025   Blood, UA Positive    pH, UA 6.0 5.0 - 8.0   Protein, UA Negative Negative   Urobilinogen, UA 2.0 (A) 0.2 or 1.0 E.U./dL   Nitrite, UA Positive    Leukocytes, UA 4+ (A) Negative        Assessment & Plan:   Problem List Items Addressed This Visit   None Visit Diagnoses     Acute cystitis with hematuria    -  Primary   Treat with macrobid BID x5 days. Will give diflucan after finishing to prevent yeast infection. Encourage fluids. Add on urine culture. F/U if not improving.   Relevant Orders   Urine Culture   Pain with urination       U/A positive for 4+ leukocytes and blood. Treat for UTI. can continue azo prn.  Relevant Orders   POCT Urinalysis Dipstick (Automated) (Completed)       Meds ordered this encounter  Medications   nitrofurantoin, macrocrystal-monohydrate, (MACROBID) 100 MG capsule    Sig: Take 1 capsule (100 mg total) by mouth 2 (two) times daily.    Dispense:  10 capsule    Refill:  0   fluconazole (DIFLUCAN) 150 MG tablet    Sig: Take 1 tablet after finishing the antibiotic and one 3 days later if needed.    Dispense:  2 tablet    Refill:  0    Return if symptoms worsen or fail to improve.  Charyl Dancer, NP

## 2022-06-26 LAB — URINE CULTURE
MICRO NUMBER:: 14601319
SPECIMEN QUALITY:: ADEQUATE

## 2022-07-27 ENCOUNTER — Ambulatory Visit (INDEPENDENT_AMBULATORY_CARE_PROVIDER_SITE_OTHER): Payer: BC Managed Care – PPO | Admitting: Nurse Practitioner

## 2022-07-27 ENCOUNTER — Encounter: Payer: Self-pay | Admitting: Nurse Practitioner

## 2022-07-27 VITALS — BP 118/82 | HR 61 | Resp 16 | Ht 63.0 in | Wt 154.8 lb

## 2022-07-27 DIAGNOSIS — R102 Pelvic and perineal pain: Secondary | ICD-10-CM

## 2022-07-27 LAB — POC URINALSYSI DIPSTICK (AUTOMATED)
Bilirubin, UA: NEGATIVE
Blood, UA: NEGATIVE
Glucose, UA: NEGATIVE
Ketones, UA: NEGATIVE
Leukocytes, UA: NEGATIVE
Nitrite, UA: NEGATIVE
Protein, UA: NEGATIVE
Spec Grav, UA: 1.01 (ref 1.010–1.025)
Urobilinogen, UA: 0.2 E.U./dL
pH, UA: 5.5 (ref 5.0–8.0)

## 2022-07-27 NOTE — Patient Instructions (Addendum)
Continue to maintain adequate oral hydration Urine sent for culture. Call office if symptoms worsen Schedule appt with Dr. Collene Mares for repeat colonoscopy Schedule appt with GYN for annual mammogram

## 2022-07-27 NOTE — Progress Notes (Signed)
Established Patient Visit  Patient: Deborah Spencer   DOB: Aug 27, 1970   52 y.o. Female  MRN: [id number] Visit Date: 07/27/2022  Subjective:    Chief Complaint  Patient presents with   Flank Pain    Lower abdominal pressure and flank pain    Urinary Tract Infection  This is a recurrent problem. The current episode started in the past 7 days. The problem occurs every urination. The problem has been unchanged. The pain is at a severity of 0/10. The patient is experiencing no pain. There has been no fever. She is Not sexually active. There is No history of pyelonephritis. Associated symptoms include urgency. Pertinent negatives include no chills, discharge, flank pain, frequency, hematuria, hesitancy, nausea, possible pregnancy, sweats or vomiting. She has tried increased fluids for the symptoms. The treatment provided no relief. There is no history of recurrent UTIs or urinary stasis.  Completed macrobib course 37month ago due to UTI caused by E. coli No vaginal symptoms, no dyspareunia   Reviewed medical, surgical, and social history today  Medications: Outpatient Medications Prior to Visit  Medication Sig   cetirizine (ZYRTEC) 10 MG tablet Take 10 mg by mouth daily.   fluticasone (FLONASE) 50 MCG/ACT nasal spray Place 2 sprays into both nostrils daily.   Rhubarb (ESTROVEN COMPLETE PO) Take by mouth.   fluconazole (DIFLUCAN) 150 MG tablet Take 1 tablet after finishing the antibiotic and one 3 days later if needed. (Patient not taking: Reported on 07/27/2022)   nitrofurantoin, macrocrystal-monohydrate, (MACROBID) 100 MG capsule Take 1 capsule (100 mg total) by mouth 2 (two) times daily. (Patient not taking: Reported on 07/27/2022)   No facility-administered medications prior to visit.   Reviewed past medical and social history.   ROS per HPI above      Objective:  BP 118/82 (BP Location: Left Arm, Patient Position: Sitting, Cuff Size: Normal)   Pulse 61   Resp 16    Ht 5\' 3"  (1.6 m)   Wt 154 lb 12.8 oz (70.2 kg)   LMP 06/30/2014   SpO2 97%   BMI 27.42 kg/m      Physical Exam Vitals reviewed.  Cardiovascular:     Rate and Rhythm: Normal rate.     Pulses: Normal pulses.  Pulmonary:     Effort: Pulmonary effort is normal.  Abdominal:     General: Bowel sounds are normal. There is no distension.     Tenderness: There is no abdominal tenderness. There is no right CVA tenderness, left CVA tenderness or guarding.  Skin:    Findings: No erythema or rash.  Neurological:     Mental Status: She is alert and oriented to person, place, and time.     Results for orders placed or performed in visit on 07/27/22  POCT Urinalysis Dipstick (Automated)  Result Value Ref Range   Color, UA Yellow    Clarity, UA clear    Glucose, UA Negative Negative   Bilirubin, UA neg    Ketones, UA neg    Spec Grav, UA 1.010 1.010 - 1.025   Blood, UA neg    pH, UA 5.5 5.0 - 8.0   Protein, UA Negative Negative   Urobilinogen, UA 0.2 0.2 or 1.0 E.U./dL   Nitrite, UA neg    Leukocytes, UA Negative Negative      Assessment & Plan:    Problem List Items Addressed This Visit   None Visit Diagnoses  Suprapubic pressure    -  Primary   Relevant Orders   POCT Urinalysis Dipstick (Automated) (Completed)   Urine Culture   POCT urinalysis dipstick     Continue to maintain adequate oral hydration Urine sent for culture. Call office if symptoms worsen  Return if symptoms worsen or fail to improve.     Wilfred Lacy, NP

## 2022-07-28 LAB — URINE CULTURE
MICRO NUMBER:: 14743100
Result:: NO GROWTH
SPECIMEN QUALITY:: ADEQUATE

## 2022-12-15 DIAGNOSIS — Z130 Encounter for screening for diseases of the blood and blood-forming organs and certain disorders involving the immune mechanism: Secondary | ICD-10-CM | POA: Diagnosis not present

## 2022-12-15 DIAGNOSIS — Z1231 Encounter for screening mammogram for malignant neoplasm of breast: Secondary | ICD-10-CM | POA: Diagnosis not present

## 2022-12-15 DIAGNOSIS — Z01419 Encounter for gynecological examination (general) (routine) without abnormal findings: Secondary | ICD-10-CM | POA: Diagnosis not present

## 2022-12-15 LAB — HM MAMMOGRAPHY

## 2023-01-26 DIAGNOSIS — D225 Melanocytic nevi of trunk: Secondary | ICD-10-CM | POA: Diagnosis not present

## 2023-01-26 DIAGNOSIS — L820 Inflamed seborrheic keratosis: Secondary | ICD-10-CM | POA: Diagnosis not present

## 2023-01-26 DIAGNOSIS — Z1283 Encounter for screening for malignant neoplasm of skin: Secondary | ICD-10-CM | POA: Diagnosis not present

## 2023-04-12 ENCOUNTER — Encounter: Payer: BC Managed Care – PPO | Admitting: Nurse Practitioner

## 2023-06-02 ENCOUNTER — Encounter: Payer: Self-pay | Admitting: Nurse Practitioner

## 2023-06-02 ENCOUNTER — Ambulatory Visit: Payer: BC Managed Care – PPO | Admitting: Nurse Practitioner

## 2023-06-02 VITALS — BP 132/78 | HR 73 | Temp 98.6°F | Resp 18 | Ht 63.0 in | Wt 152.6 lb

## 2023-06-02 DIAGNOSIS — Z0000 Encounter for general adult medical examination without abnormal findings: Secondary | ICD-10-CM | POA: Diagnosis not present

## 2023-06-02 DIAGNOSIS — E7800 Pure hypercholesterolemia, unspecified: Secondary | ICD-10-CM | POA: Diagnosis not present

## 2023-06-02 LAB — COMPREHENSIVE METABOLIC PANEL
ALT: 10 U/L (ref 0–35)
AST: 16 U/L (ref 0–37)
Albumin: 4.6 g/dL (ref 3.5–5.2)
Alkaline Phosphatase: 63 U/L (ref 39–117)
BUN: 17 mg/dL (ref 6–23)
CO2: 28 meq/L (ref 19–32)
Calcium: 9.6 mg/dL (ref 8.4–10.5)
Chloride: 104 meq/L (ref 96–112)
Creatinine, Ser: 0.88 mg/dL (ref 0.40–1.20)
GFR: 74.83 mL/min (ref >60.00)
Glucose, Bld: 84 mg/dL (ref 70–99)
Potassium: 4.1 meq/L (ref 3.5–5.1)
Sodium: 141 meq/L (ref 135–145)
Total Bilirubin: 0.4 mg/dL (ref 0.2–1.2)
Total Protein: 7.5 g/dL (ref 6.0–8.3)

## 2023-06-02 LAB — LIPID PANEL
Cholesterol: 187 mg/dL (ref 0–200)
HDL: 74 mg/dL (ref >39.00)
LDL Cholesterol: 101 mg/dL — ABNORMAL HIGH (ref 0–99)
NonHDL: 112.86
Total CHOL/HDL Ratio: 3
Triglycerides: 57 mg/dL (ref 0.0–149.0)
VLDL: 11.4 mg/dL (ref 0.0–40.0)

## 2023-06-02 LAB — CBC
HCT: 41.8 % (ref 36.0–46.0)
Hemoglobin: 13.9 g/dL (ref 12.0–15.0)
MCHC: 33.2 g/dL (ref 30.0–36.0)
MCV: 92.2 fL (ref 78.0–100.0)
Platelets: 269 10*3/uL (ref 150.0–400.0)
RBC: 4.54 Mil/uL (ref 3.87–5.11)
RDW: 12.8 % (ref 11.5–15.5)
WBC: 5.5 10*3/uL (ref 4.0–10.5)

## 2023-06-02 NOTE — Progress Notes (Signed)
Complete physical exam  Patient: Deborah Spencer   DOB: [birth year]   53 y.o. Female  MRN: [id number] Visit Date: 06/02/2023  Subjective:    Chief Complaint  Patient presents with   Annual Exam    Shingles vaccine is due; mammogram records requested    Deborah Spencer is a 53 y.o. female who presents today for a complete physical exam. She reports consuming a low fat and low sodium diet. Home exercise routine includes calisthenics and cardio. She generally feels well. She reports sleeping well. She does not have additional problems to discuss today.  Vision:No Dental:Yes STD Screen:No Opted to schedule nurse visit for shingrix vaccine.  BP Readings from Last 3 Encounters:  06/02/23 132/78  07/27/22 118/82  06/24/22 122/80   Wt Readings from Last 3 Encounters:  06/02/23 152 lb 9.6 oz (69.2 kg)  07/27/22 154 lb 12.8 oz (70.2 kg)  06/24/22 155 lb (70.3 kg)   Most recent fall risk assessment:    06/02/2023    9:13 AM  Fall Risk   Falls in the past year? 0  Number falls in past yr: 0  Injury with Fall? 0  Risk for fall due to : Other (Comment)  Follow up Falls evaluation completed   Depression screen:Yes - No Depression Most recent depression screenings:    06/02/2023    9:17 AM 04/08/2022   10:57 AM  PHQ 2/9 Scores  PHQ - 2 Score 0 0  PHQ- 9 Score 2 4   HPI  No problem-specific Assessment & Plan notes found for this encounter.  Past Medical History:  Diagnosis Date   Abnormal liver function tests 01/13/2021   History of postmenopausal bleeding    Shingles 06/2012   hx of   Past Surgical History:  Procedure Laterality Date   CESAREAN SECTION     x 3    ENDOMETRIAL BIOPSY  02/20/2016   LASIK     Social History   Socioeconomic History   Marital status: Married    Spouse name: Not on file   Number of children: 3   Years of education: Not on file   Highest education level: Not on file  Occupational History   Occupation: subs- Administrator, arts:  [employer]  Tobacco Use   Smoking status: Never   Smokeless tobacco: Never  Vaping Use   Vaping status: Never Used  Substance and Sexual Activity   Alcohol use: Yes    Comment: socially    Drug use: No   Sexual activity: Not on file  Other Topics Concern   Not on file  Social History Narrative   Not on file   Social Drivers of Health   Financial Resource Strain: Not on file  Food Insecurity: Not on file  Transportation Needs: Not on file  Physical Activity: Not on file  Stress: Not on file  Social Connections: Not on file  Intimate Partner Violence: Not on file   No family status information on file.   History reviewed. No pertinent family history. Allergies  Allergen Reactions   Iodinated Contrast Media    Iohexol      Code: RASH, Desc: PT IS ALLERGIC TO IVP DYE 05/26/06/RM., Onset Date: [id number]     Patient Care Team: Reiana Poteet, Bonna Gains, NP as PCP - General (Internal Medicine) Huel Cote, MD as Consulting Physician (Obstetrics and Gynecology) Eileen Stanford, MD as Referring Physician (Allergy and Immunology)   Medications: Outpatient Medications Prior to Visit  Medication Sig   cetirizine (ZYRTEC) 10 MG tablet Take 10 mg by mouth daily.   estradiol (ESTRACE) 0.1 MG/GM vaginal cream 0.5 G INSERT INTO THE VAGINA ONCE A DAY FOR ONE WEEK AND THEN USE IT TWICE A WEEK   FLUCELVAX 0.5 ML injection    fluticasone (FLONASE) 50 MCG/ACT nasal spray Place 2 sprays into both nostrils daily.   Rhubarb (ESTROVEN COMPLETE PO) Take by mouth.   fluconazole (DIFLUCAN) 150 MG tablet Take 1 tablet after finishing the antibiotic and one 3 days later if needed. (Patient not taking: Reported on 06/02/2023)   nitrofurantoin, macrocrystal-monohydrate, (MACROBID) 100 MG capsule Take 1 capsule (100 mg total) by mouth 2 (two) times daily. (Patient not taking: Reported on 06/02/2023)   No facility-administered medications prior to visit.    Review of Systems   Constitutional:  Negative for activity change, appetite change and unexpected weight change.  Respiratory: Negative.    Cardiovascular: Negative.   Gastrointestinal: Negative.   Endocrine: Negative for cold intolerance and heat intolerance.  Genitourinary: Negative.   Musculoskeletal: Negative.   Skin: Negative.   Neurological: Negative.   Hematological: Negative.   Psychiatric/Behavioral:  Negative for behavioral problems, decreased concentration, dysphoric mood, hallucinations, self-injury, sleep disturbance and suicidal ideas. The patient is not nervous/anxious.         Objective:  BP 132/78 (BP Location: Left Arm, Patient Position: Sitting, Cuff Size: Large)   Pulse 73   Temp 98.6 F (37 C) (Temporal)   Resp 18   Ht 5\' 3"  (1.6 m)   Wt 152 lb 9.6 oz (69.2 kg)   LMP 06/30/2014   SpO2 100%   BMI 27.03 kg/m     Physical Exam Vitals and nursing note reviewed.  Constitutional:      General: She is not in acute distress. HENT:     Right Ear: Tympanic membrane, ear canal and external ear normal.     Left Ear: Tympanic membrane, ear canal and external ear normal.     Nose: Nose normal.  Eyes:     Extraocular Movements: Extraocular movements intact.     Conjunctiva/sclera: Conjunctivae normal.     Pupils: Pupils are equal, round, and reactive to light.  Neck:     Thyroid: No thyroid mass, thyromegaly or thyroid tenderness.  Cardiovascular:     Rate and Rhythm: Normal rate and regular rhythm.     Pulses: Normal pulses.     Heart sounds: Normal heart sounds.  Pulmonary:     Effort: Pulmonary effort is normal.     Breath sounds: Normal breath sounds.  Abdominal:     General: Bowel sounds are normal.     Palpations: Abdomen is soft.  Musculoskeletal:        General: Normal range of motion.     Cervical back: Normal range of motion and neck supple.     Right lower leg: No edema.     Left lower leg: No edema.  Lymphadenopathy:     Cervical: No cervical adenopathy.   Skin:    General: Skin is warm and dry.  Neurological:     Mental Status: She is alert and oriented to person, place, and time.     Cranial Nerves: No cranial nerve deficit.  Psychiatric:        Mood and Affect: Mood normal.        Behavior: Behavior normal.        Thought Content: Thought content normal.     No results found for  any visits on 06/02/23.    Assessment & Plan:    Routine Health Maintenance and Physical Exam  Immunization History  Administered Date(s) Administered   Influenza Split 01/31/2013   Influenza,inj,Quad PF,6+ Mos 02/11/2020, 02/24/2021   Influenza-Unspecified 02/25/2015, 01/31/2017, 01/29/2019, 03/07/2022, 03/03/2023   PFIZER(Purple Top)SARS-COV-2 Vaccination 06/30/2019, 07/21/2019, 03/15/2020, 12/08/2020   Td 05/03/1997, 06/27/2009   Health Maintenance  Topic Date Due   Zoster Vaccines- Shingrix (1 of 2) Never done   MAMMOGRAM  05/20/2022   Colonoscopy  08/28/2023   COVID-19 Vaccine (5 - 2024-25 season) 08/01/2023 (Originally 01/02/2023)   Hepatitis C Screening  06/01/2024 (Originally 08/17/1987)   HIV Screening  06/01/2024 (Originally 08/16/1984)   Cervical Cancer Screening (HPV/Pap Cotest)  05/20/2026   INFLUENZA VACCINE  Completed   HPV VACCINES  Aged Out   DTaP/Tdap/Td  Discontinued   Discussed health benefits of physical activity, and encouraged her to engage in regular exercise appropriate for her age and condition. Advised to schedule appointment with Dr. Loreta Ave for repeat colonoscopy.  Problem List Items Addressed This Visit   None Visit Diagnoses       Preventative health care    -  Primary   Relevant Orders   CBC   Comprehensive metabolic panel     Elevated LDL cholesterol level       Relevant Orders   Lipid panel      Return in about 1 year (around 06/01/2024) for CPE (fasting).     Alysia Penna, NP

## 2023-06-02 NOTE — Patient Instructions (Addendum)
Go to lab Schedule nurse visit for shingrix vaccine Schedule appointment with Dr. Loreta Ave for repeat colonoscopy Maintain Heart healthy diet and daily exercise.

## 2023-06-07 ENCOUNTER — Ambulatory Visit (INDEPENDENT_AMBULATORY_CARE_PROVIDER_SITE_OTHER): Payer: BC Managed Care – PPO

## 2023-06-07 DIAGNOSIS — Z23 Encounter for immunization: Secondary | ICD-10-CM

## 2023-06-07 NOTE — Progress Notes (Signed)
 Deborah Spencer is a 53 y.o. female presents to the office today for shingles vaccine injection, per physician's orders Deborah Spencer  Original order: 06/07/2023  Administered shingles vaccine in right deltoid (IM) today. Patient tolerated injection. Patient due for follow up labs/provider appt: Yes.  appointment made Yes Patient next injection due: 07/05/2023, (2 months) appointment made Yes  Etha Stambaugh L Christell Steinmiller

## 2023-07-05 ENCOUNTER — Ambulatory Visit: Payer: BC Managed Care – PPO

## 2023-08-09 ENCOUNTER — Ambulatory Visit: Payer: BC Managed Care – PPO

## 2023-08-09 DIAGNOSIS — Z23 Encounter for immunization: Secondary | ICD-10-CM | POA: Diagnosis not present

## 2023-08-09 NOTE — Progress Notes (Signed)
 Patient is in office today for a nurse visit   2nd shingles vaccine . Patient Injection was given in the  Right deltoid. Patient tolerated injection well.

## 2023-08-11 DIAGNOSIS — Z1211 Encounter for screening for malignant neoplasm of colon: Secondary | ICD-10-CM | POA: Diagnosis not present

## 2023-08-11 DIAGNOSIS — K5900 Constipation, unspecified: Secondary | ICD-10-CM | POA: Diagnosis not present

## 2023-08-11 DIAGNOSIS — K219 Gastro-esophageal reflux disease without esophagitis: Secondary | ICD-10-CM | POA: Diagnosis not present

## 2023-08-11 DIAGNOSIS — Z860100 Personal history of colon polyps, unspecified: Secondary | ICD-10-CM | POA: Diagnosis not present

## 2023-09-07 DIAGNOSIS — K635 Polyp of colon: Secondary | ICD-10-CM | POA: Diagnosis not present

## 2023-09-07 DIAGNOSIS — D125 Benign neoplasm of sigmoid colon: Secondary | ICD-10-CM | POA: Diagnosis not present

## 2023-09-07 DIAGNOSIS — D122 Benign neoplasm of ascending colon: Secondary | ICD-10-CM | POA: Diagnosis not present

## 2023-09-07 DIAGNOSIS — K5730 Diverticulosis of large intestine without perforation or abscess without bleeding: Secondary | ICD-10-CM | POA: Diagnosis not present

## 2023-09-07 DIAGNOSIS — Z800 Family history of malignant neoplasm of digestive organs: Secondary | ICD-10-CM | POA: Diagnosis not present

## 2023-09-07 DIAGNOSIS — Z860101 Personal history of adenomatous and serrated colon polyps: Secondary | ICD-10-CM | POA: Diagnosis not present

## 2023-09-07 DIAGNOSIS — Z1211 Encounter for screening for malignant neoplasm of colon: Secondary | ICD-10-CM | POA: Diagnosis not present

## 2023-09-07 DIAGNOSIS — K621 Rectal polyp: Secondary | ICD-10-CM | POA: Diagnosis not present

## 2024-01-03 ENCOUNTER — Ambulatory Visit (INDEPENDENT_AMBULATORY_CARE_PROVIDER_SITE_OTHER): Admitting: Internal Medicine

## 2024-01-03 ENCOUNTER — Ambulatory Visit: Payer: Self-pay

## 2024-01-03 ENCOUNTER — Encounter: Payer: Self-pay | Admitting: Internal Medicine

## 2024-01-03 VITALS — BP 128/78 | HR 74 | Ht 63.0 in | Wt 156.0 lb

## 2024-01-03 DIAGNOSIS — H66002 Acute suppurative otitis media without spontaneous rupture of ear drum, left ear: Secondary | ICD-10-CM

## 2024-01-03 DIAGNOSIS — K5730 Diverticulosis of large intestine without perforation or abscess without bleeding: Secondary | ICD-10-CM | POA: Insufficient documentation

## 2024-01-03 DIAGNOSIS — Z800 Family history of malignant neoplasm of digestive organs: Secondary | ICD-10-CM | POA: Insufficient documentation

## 2024-01-03 DIAGNOSIS — N951 Menopausal and female climacteric states: Secondary | ICD-10-CM | POA: Insufficient documentation

## 2024-01-03 MED ORDER — AMOXICILLIN-POT CLAVULANATE 875-125 MG PO TABS
1.0000 | ORAL_TABLET | Freq: Two times a day (BID) | ORAL | 0 refills | Status: AC
Start: 2024-01-03 — End: 2024-01-10

## 2024-01-03 NOTE — Telephone Encounter (Signed)
 Patient has an appointment on 01/03/24 at 3:40  Avelina Finder, CMA

## 2024-01-03 NOTE — Progress Notes (Signed)
 [facility] [address] [place] [address] Dept: [phone] Dept Fax: [phone]  Acute Care Office Visit  Subjective:   Deborah Spencer [birth year] 01/03/2024  Chief Complaint  Patient presents with   Otalgia    Left ear, since this morning, denies drainage from ear, feels pressure, does report muffled hearing    HPI:  Discussed the use of AI scribe software for clinical note transcription with the patient, who gave verbal consent to proceed.  History of Present Illness   Deborah Spencer is a 53 year old female who presents with left ear pain.  She woke up with left ear pain after going to bed feeling fine the previous night. Initially, she thought it might be due to clenching her jaw, but later realized it was her ear causing the discomfort. The pain is described as dull rather than severe, with a sensation of fullness and occasional muffled hearing. She notes a lot of pressure in the ear, especially when blowing her nose, which causes a popping sensation.  No recent increase in allergy symptoms, fever, chills, ear drainage, or hearing loss. She has not experienced any ear problems since middle school. She mentions visiting a lake over the weekend but did not submerge her head underwater.  Her current medications include Zyrtec and Flonase, which she uses as needed for allergies. She took Zyrtec last night and Flonase this morning.      The following portions of the patient's history were reviewed and updated as appropriate: past medical history, past surgical history, family history, social history, allergies, medications, and problem list.   Patient Active Problem List   Diagnosis Date Noted   Diverticular disease of colon 01/03/2024   Menopausal symptom 01/03/2024   Family history of colon cancer 01/03/2024   Gastroesophageal reflux disease 04/08/2022   Irritable bowel syndrome 04/08/2022   Hx of colonic  polyp 02/24/2021   Insomnia associated with menopause 01/13/2021   Plantar wart 08/28/2012   EYE SURGERY, HX OF 09/19/2006   Past Medical History:  Diagnosis Date   Abnormal liver function tests 01/13/2021   History of postmenopausal bleeding    Shingles 06/2012   hx of   Past Surgical History:  Procedure Laterality Date   CESAREAN SECTION     x 3    ENDOMETRIAL BIOPSY  02/20/2016   LASIK     History reviewed. No pertinent family history.  Current Outpatient Medications:    amoxicillin -clavulanate (AUGMENTIN ) 875-125 MG tablet, Take 1 tablet by mouth 2 (two) times daily for 7 days., Disp: 14 tablet, Rfl: 0   cetirizine (ZYRTEC) 10 MG tablet, Take 10 mg by mouth daily., Disp: , Rfl:    estradiol (ESTRACE) 0.1 MG/GM vaginal cream, 0.5 G INSERT INTO THE VAGINA ONCE A DAY FOR ONE WEEK AND THEN USE IT TWICE A WEEK, Disp: , Rfl:    fluticasone (FLONASE) 50 MCG/ACT nasal spray, Place 2 sprays into both nostrils daily., Disp: , Rfl:    Rhubarb (ESTROVEN COMPLETE PO), Take by mouth., Disp: , Rfl:  Allergies  Allergen Reactions   Iodinated Contrast Media    Iohexol      Code: RASH, Desc: PT IS ALLERGIC TO IVP DYE 05/26/06/RM., Onset Date: [id number]    Pollen Extract     Other Reaction(s): Unknown     ROS: A complete ROS was performed with pertinent positives/negatives noted in the HPI. The remainder of the ROS are negative.    Objective:  Today's Vitals   01/03/24 1547  BP: 128/78  Pulse: 74  SpO2: 98%  Weight: 156 lb (70.8 kg)  Height: 5' 3 (1.6 m)    GENERAL: Well-appearing, in NAD. Well nourished.  SKIN: Pink, warm and dry. No rash.  HEENT:    HEAD: Normocephalic, non-traumatic.  EYES: Conjunctive pink without exudate.  EARS: External ear w/o redness, swelling, masses, or lesions. EAC clear. RIGHT:  TM intact, translucent w/o bulging, appropriate landmarks visualized.              LEFT: TM intact, erythematous, bulging.   NOSE: Septum midline w/o deformity. Nares  patent, mucosa pink and non-inflamed w/o drainage. No sinus tenderness.  THROAT: Uvula midline. Oropharynx clear. Tonsils non-inflamed w/o exudate . Mucus membranes pink and moist.  NECK: Trachea midline. Full ROM w/o pain or tenderness. No lymphadenopathy.  RESPIRATORY: Chest wall symmetrical. Respirations even and non-labored. Breath sounds clear to auscultation bilaterally.  CARDIAC: S1, S2 present, regular rate and rhythm. Peripheral pulses 2+ bilaterally.  EXTREMITIES: Without clubbing, cyanosis, or edema.  NEUROLOGIC: Steady, even gait.  PSYCH/MENTAL STATUS: Alert, oriented x 3. Cooperative, appropriate mood and affect.    No results found for any visits on 01/03/24.    Assessment & Plan:  Assessment and Plan    Acute suppurative otitis media, left ear Acute suppurative otitis media in the left ear with dull pain, pressure, and muffled hearing. No fever, chills, or ear drainage. Possible seasonal allergy exacerbation or water exposure irritation. Discussed risk of eardrum rupture if symptoms worsen. - Prescribed Augmentin  1 tablet twice daily for 7 days, to be taken after meals. - Continue Flonase, 1 spray twice daily. - Continue Zyrtec, once daily. - Use Tylenol or ibuprofen for pain as needed. - Instructed to monitor for sudden hearing loss or ear drainage and return if these occur.     Meds ordered this encounter  Medications   amoxicillin -clavulanate (AUGMENTIN ) 875-125 MG tablet    Sig: Take 1 tablet by mouth 2 (two) times daily for 7 days.    Dispense:  14 tablet    Refill:  0    Supervising Provider:   THOMPSON, AARON B [[id number]]   No orders of the defined types were placed in this encounter.  Lab Orders  No laboratory test(s) ordered today   No images are attached to the encounter or orders placed in the encounter.  Return if symptoms worsen or fail to improve.   Rosina Senters, FNP

## 2024-01-03 NOTE — Patient Instructions (Signed)
 Continue Flonase 1 spray each nostril 2 times a day  Continue Zyrtec 10mg  once daily  Take antibiotic as prescribed. Take with food to avoid upset stomach  Can take tylenol/ibuprofen for pain

## 2024-01-03 NOTE — Telephone Encounter (Signed)
 FYI Only or Action Required?: FYI only for provider.  Patient was last seen in primary care on 06/02/2023 by Nche, Roselie Rockford, NP.  Called Nurse Triage reporting Ear Pain and Ear Fullness.  Symptoms began today.  Interventions attempted: Nothing.  Symptoms are: stable.  Triage Disposition: See Physician Within 24 Hours  Patient/caregiver understands and will follow disposition?: Yes                             Copied from CRM [id number]. Topic: Clinical - Red Word Triage >> Jan 03, 2024  9:13 AM Frederich PARAS wrote: [facility] that prompted transfer to Nurse Triage: pain  PT Thinks she has a ear infection, in alot of  pain, pressure, it feels like fluid is in her ear, she just wokeup with it, Reason for Disposition  Earache  (Exceptions: Brief ear pain of lasting less than 60 minutes, or earache occurring during air travel.)  Answer Assessment - Initial Assessment Questions 1. LOCATION: Which ear is involved?     Left ear 2. ONSET: When did the ear pain start?      This morning 3. SEVERITY: How bad is the pain?  (Scale 1-10; mild, moderate or severe)     Mild- rates pain 2-3 at this time 4. URI SYMPTOMS: Do you have a runny nose or cough?     Congestion 5. FEVER: Do you have a fever? If Yes, ask: What is your temperature, how was it measured, and when did it start?     Denies 6. CAUSE: Have you been swimming recently?, How often do you use Q-TIPS?, Have you had any recent air travel or scuba diving?     Unsure 7. OTHER SYMPTOMS: Do you have any other symptoms? (e.g., decreased hearing, dizziness, headache, stiff neck, vomiting)     Tightness along left side of jaw, pressure in left ear, mild headache, states she can still hear out of ear, denies dizziness, denies vomiting  Protocols used: Rilla

## 2024-01-04 DIAGNOSIS — Z01419 Encounter for gynecological examination (general) (routine) without abnormal findings: Secondary | ICD-10-CM | POA: Diagnosis not present

## 2024-01-04 DIAGNOSIS — N951 Menopausal and female climacteric states: Secondary | ICD-10-CM | POA: Diagnosis not present

## 2024-01-04 DIAGNOSIS — Z130 Encounter for screening for diseases of the blood and blood-forming organs and certain disorders involving the immune mechanism: Secondary | ICD-10-CM | POA: Diagnosis not present

## 2024-01-04 DIAGNOSIS — Z1231 Encounter for screening mammogram for malignant neoplasm of breast: Secondary | ICD-10-CM | POA: Diagnosis not present

## 2024-01-31 DIAGNOSIS — D225 Melanocytic nevi of trunk: Secondary | ICD-10-CM | POA: Diagnosis not present

## 2024-01-31 DIAGNOSIS — Z1283 Encounter for screening for malignant neoplasm of skin: Secondary | ICD-10-CM | POA: Diagnosis not present

## 2024-05-29 ENCOUNTER — Other Ambulatory Visit: Payer: Self-pay | Admitting: Medical Genetics

## 2024-06-05 ENCOUNTER — Encounter: Payer: BC Managed Care – PPO | Admitting: Nurse Practitioner

## 2024-06-20 ENCOUNTER — Other Ambulatory Visit

## 2024-08-29 ENCOUNTER — Encounter: Admitting: Nurse Practitioner
